# Patient Record
Sex: Male | Born: 1985
Health system: Southern US, Community
[De-identification: ages and names within clinical notes are randomized; demographics above are authoritative.]

## PROBLEM LIST (undated history)

## (undated) DIAGNOSIS — N390 Urinary tract infection, site not specified: Secondary | ICD-10-CM

## (undated) HISTORY — DX: Urinary tract infection, site not specified: N39.0

---
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
## 2007-12-14 ENCOUNTER — Emergency Department (HOSPITAL_COMMUNITY): Admission: EM | Admit: 2007-12-14 | Discharge: 2007-12-14 | Payer: Self-pay | Admitting: Family Medicine

## 2010-11-22 ENCOUNTER — Inpatient Hospital Stay (INDEPENDENT_AMBULATORY_CARE_PROVIDER_SITE_OTHER)
Admission: RE | Admit: 2010-11-22 | Discharge: 2010-11-22 | Disposition: A | Discharge status: Home / Self Care | Payer: PRIVATE HEALTH INSURANCE | Source: Ambulatory Visit | Attending: Family Medicine | Admitting: Family Medicine

## 2010-11-22 ENCOUNTER — Ambulatory Visit (INDEPENDENT_AMBULATORY_CARE_PROVIDER_SITE_OTHER): Payer: PRIVATE HEALTH INSURANCE

## 2010-11-22 DIAGNOSIS — J189 Pneumonia, unspecified organism: Secondary | ICD-10-CM

## 2010-11-25 ENCOUNTER — Emergency Department (HOSPITAL_COMMUNITY)
Admission: EM | Admit: 2010-11-25 | Discharge: 2010-11-25 | Disposition: A | Discharge status: Home / Self Care | Payer: PRIVATE HEALTH INSURANCE | Attending: Emergency Medicine | Admitting: Emergency Medicine

## 2010-11-25 ENCOUNTER — Emergency Department (HOSPITAL_COMMUNITY): Payer: PRIVATE HEALTH INSURANCE

## 2010-11-25 DIAGNOSIS — R05 Cough: Secondary | ICD-10-CM | POA: Insufficient documentation

## 2010-11-25 DIAGNOSIS — J3489 Other specified disorders of nose and nasal sinuses: Secondary | ICD-10-CM | POA: Insufficient documentation

## 2010-11-25 DIAGNOSIS — R5381 Other malaise: Secondary | ICD-10-CM | POA: Insufficient documentation

## 2010-11-25 DIAGNOSIS — J189 Pneumonia, unspecified organism: Secondary | ICD-10-CM | POA: Insufficient documentation

## 2010-11-25 DIAGNOSIS — R059 Cough, unspecified: Secondary | ICD-10-CM | POA: Insufficient documentation

## 2010-11-25 DIAGNOSIS — R51 Headache: Secondary | ICD-10-CM | POA: Insufficient documentation

## 2012-09-29 IMAGING — CR DG CHEST 2V
2 series · 2 of 2 positions shown · non-contrast
Comparison: None.

CLINICAL DATA: Dry cough for a week, lower back pain, chest
congestion

CHEST - 2 VIEW

[view not recorded (1 of 2)]
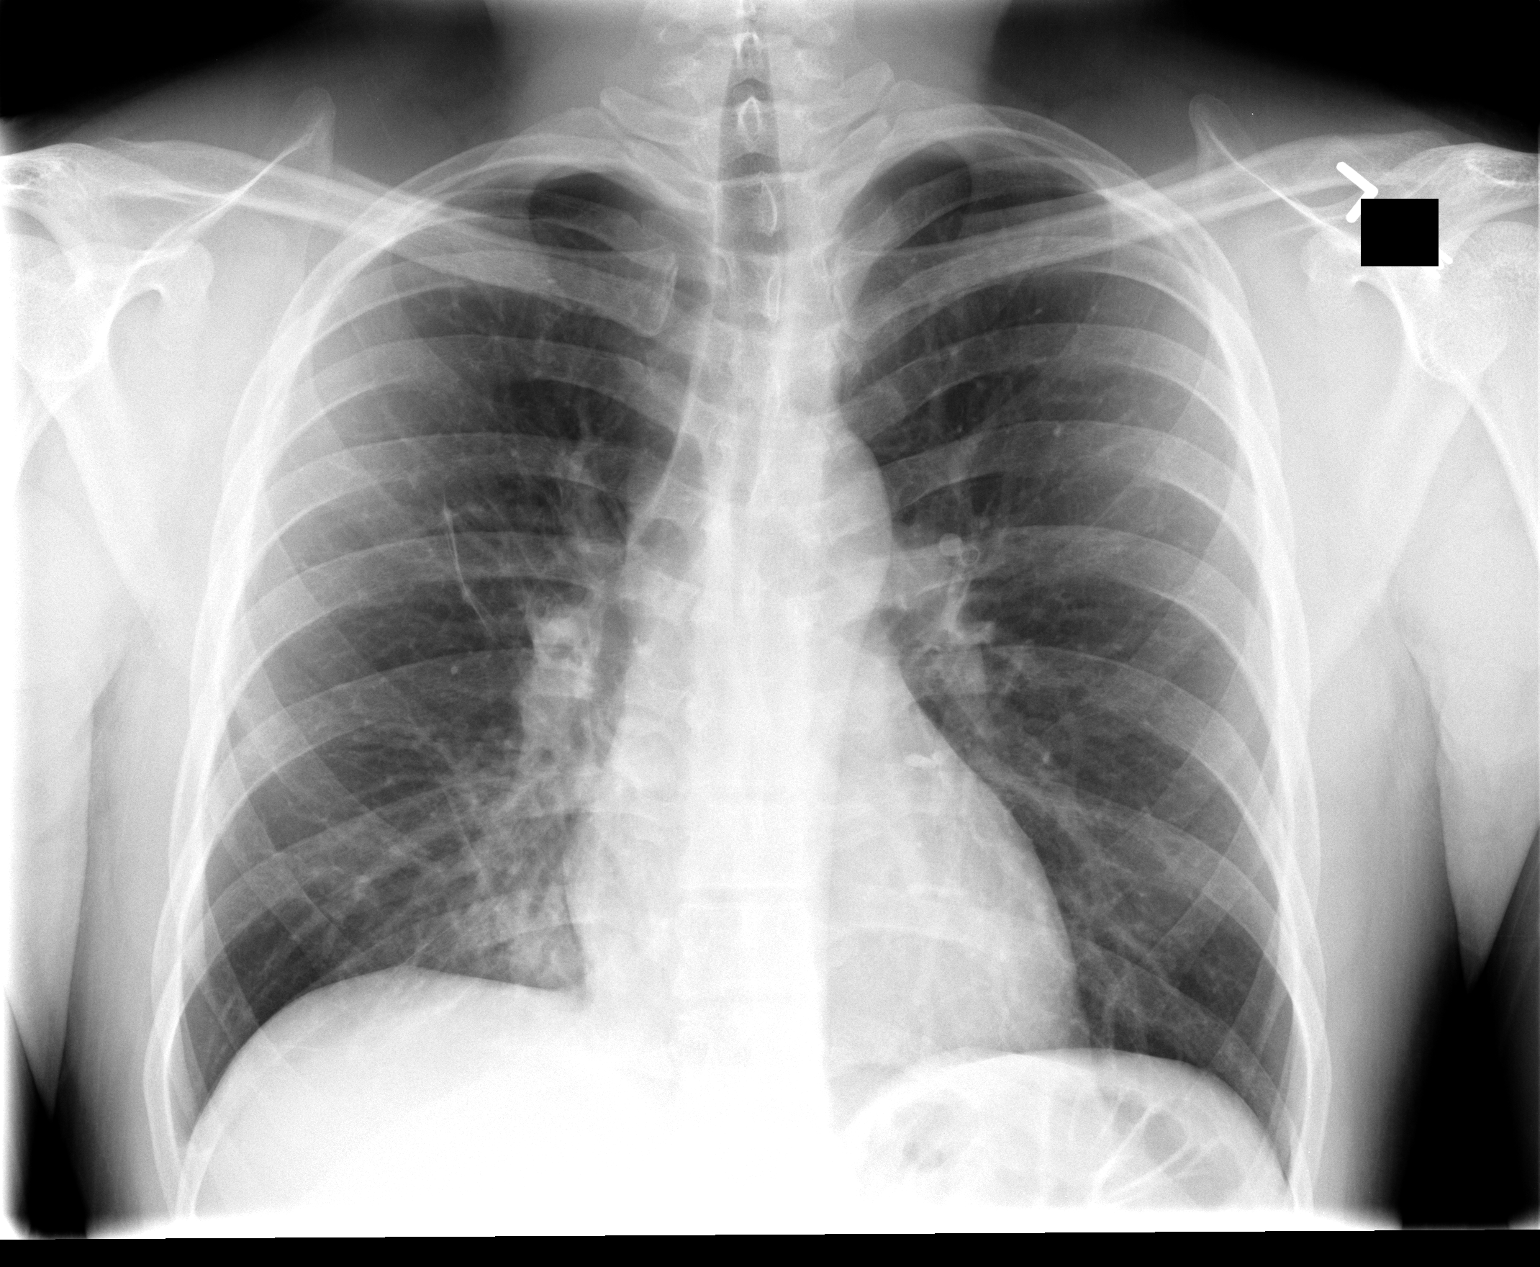

[view not recorded (2 of 2)]
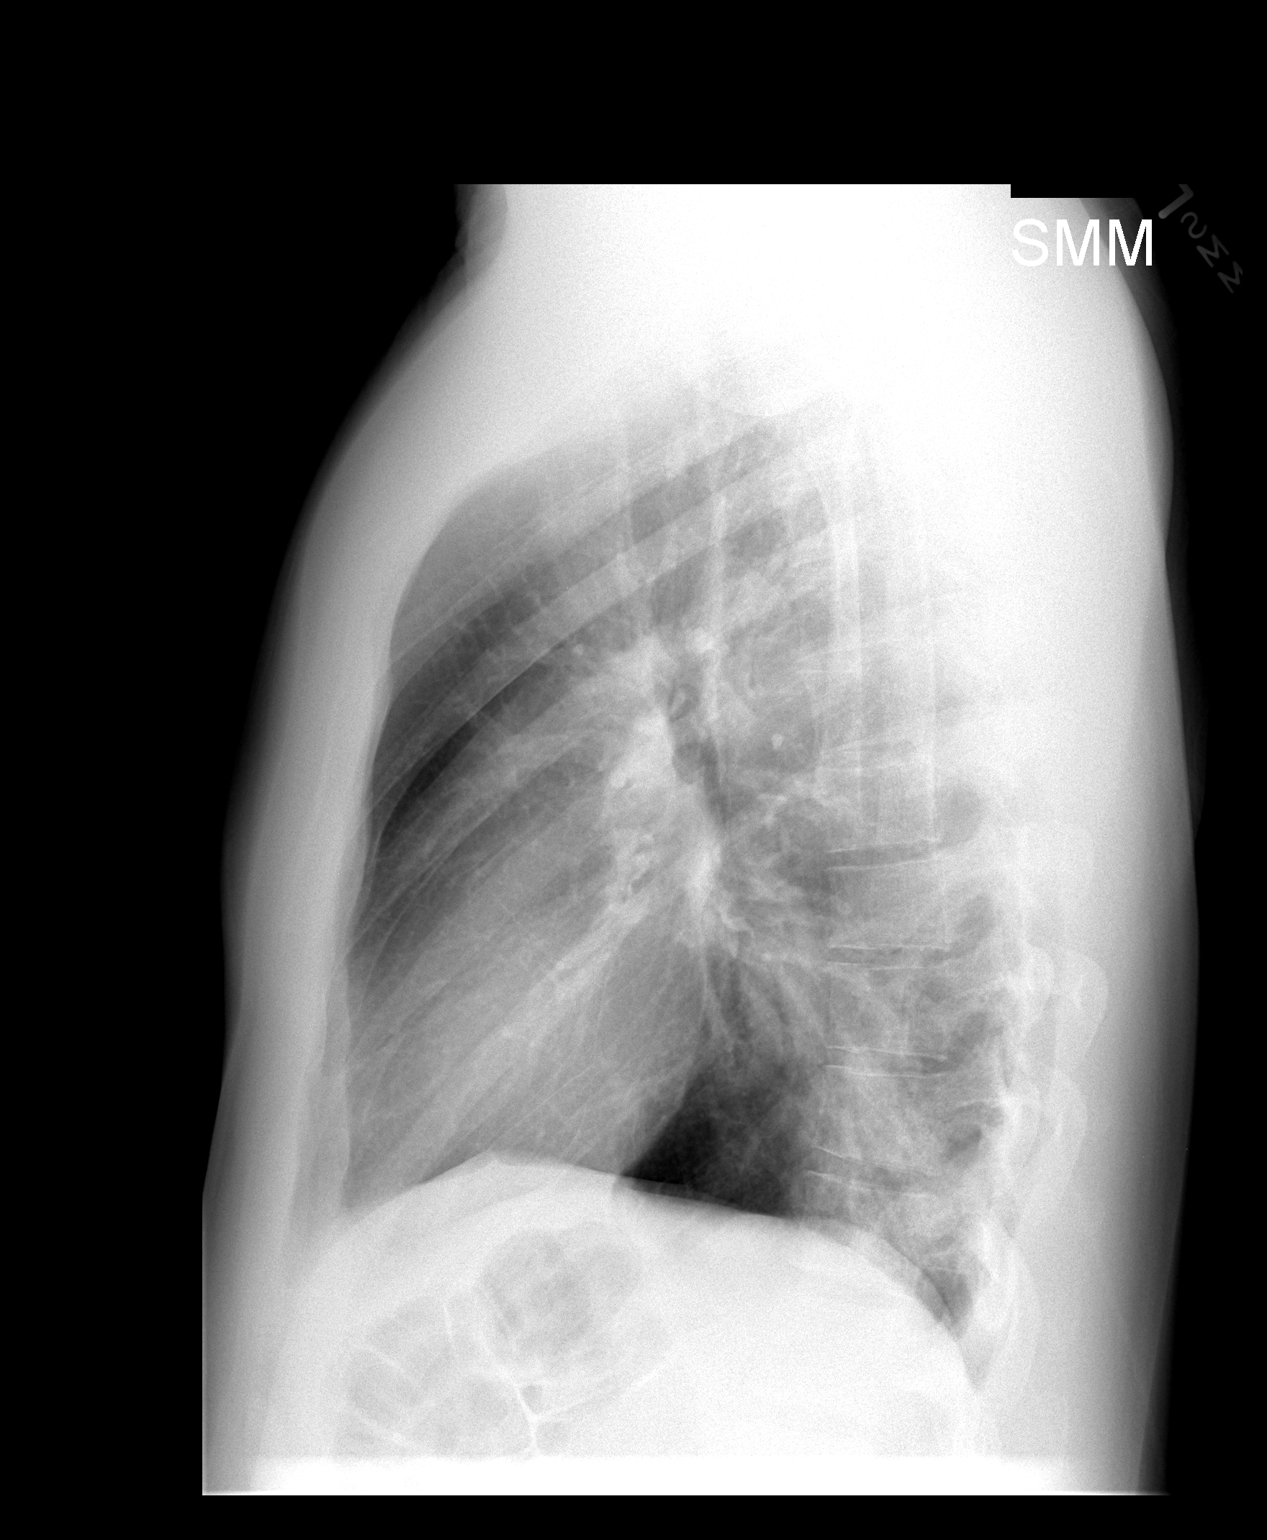

[2 of 2 positions shown; findings below may reference images not displayed]

FINDINGS: There is parenchymal opacity within the right lower lobe
posterior medially consistent with right lower lobe pneumonia.  The
left lung is clear.  No effusion is seen.  Mediastinal contours are
normal.  The heart is within normal limits in size.
IMPRESSION: Right lower lobe pneumonia.

## 2012-10-02 IMAGING — CR DG CHEST 2V
2 series · 2 of 2 positions shown · non-contrast
Comparison: 11/22/2010

CLINICAL DATA: Cough, chest pain

CHEST - 2 VIEW

[w chest pa]
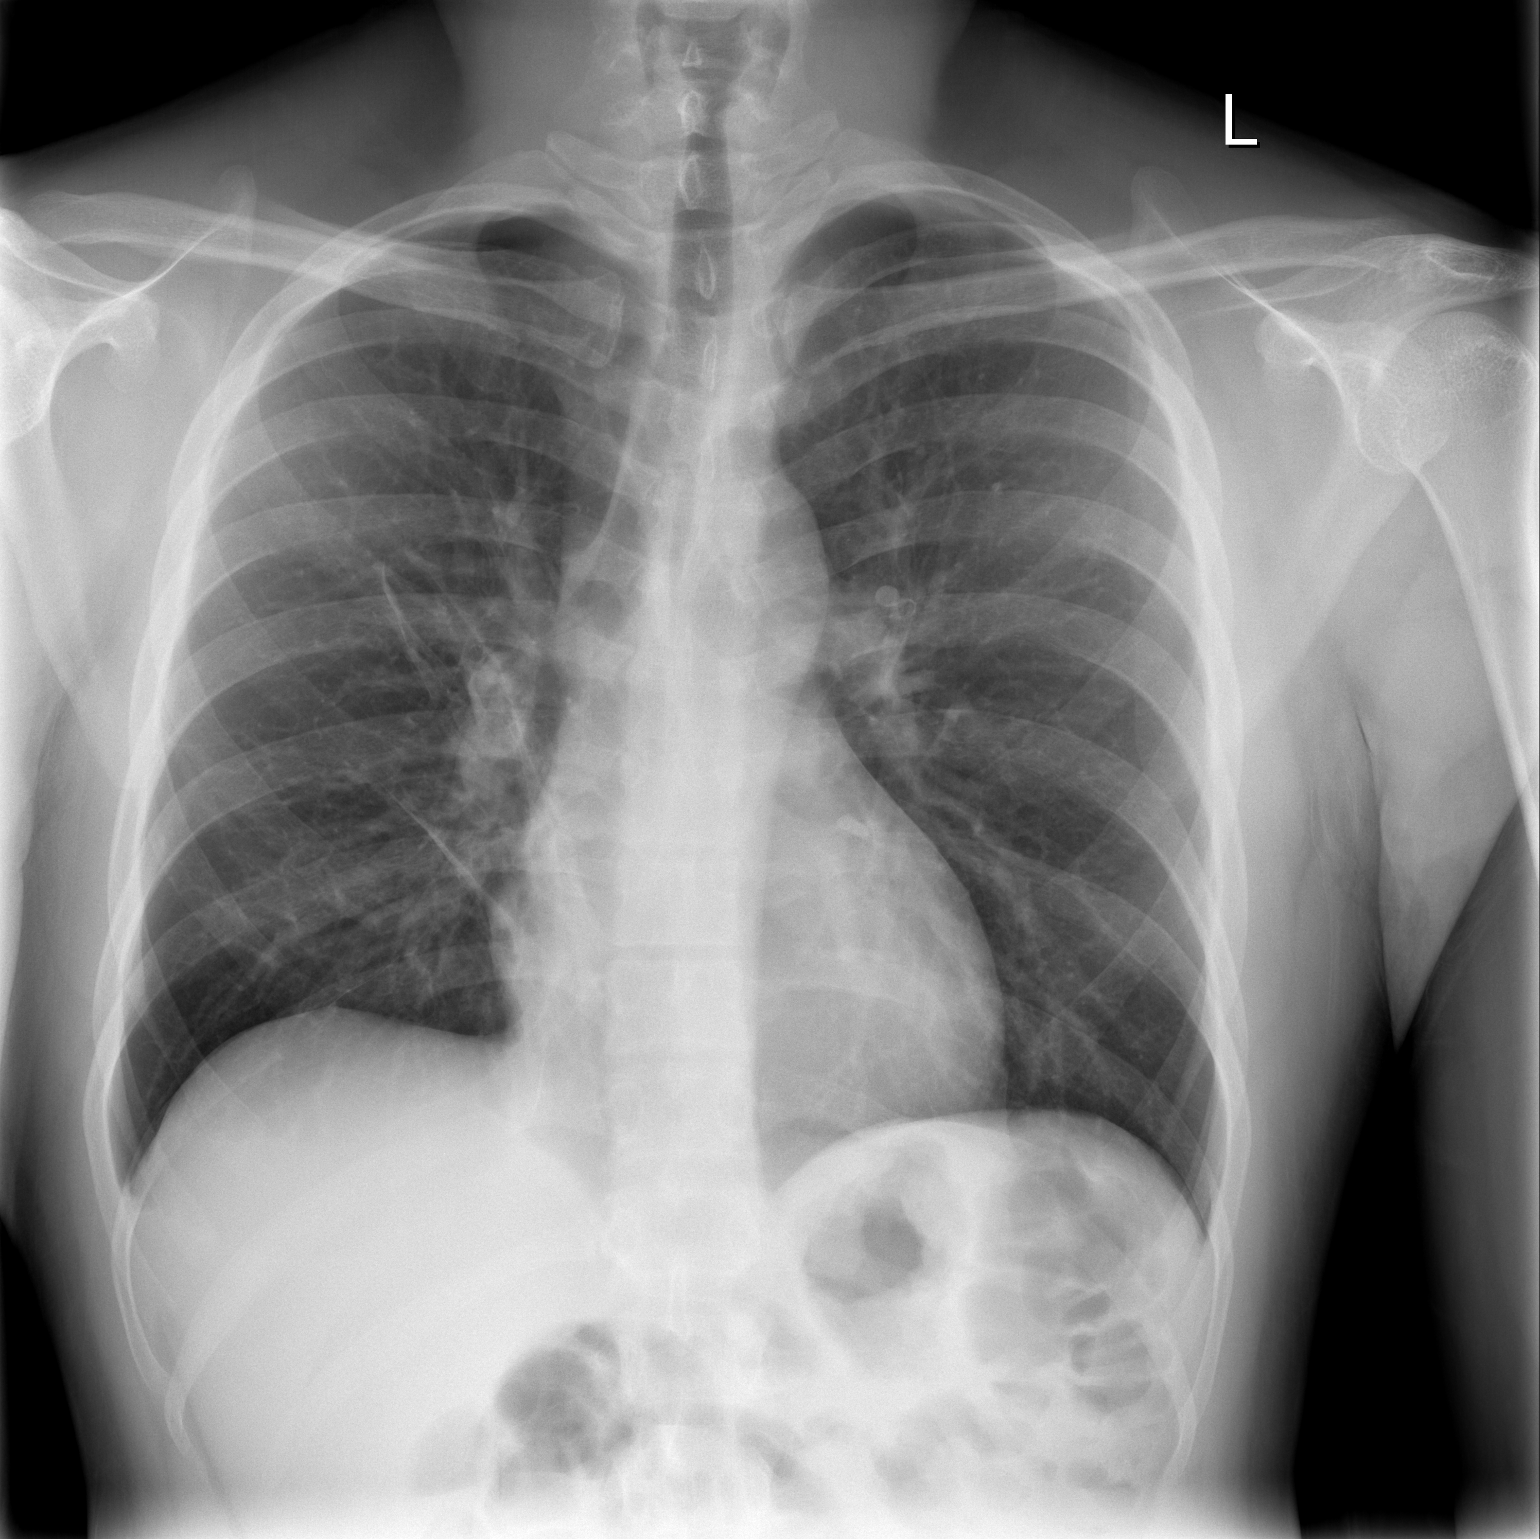

[w chest lat]
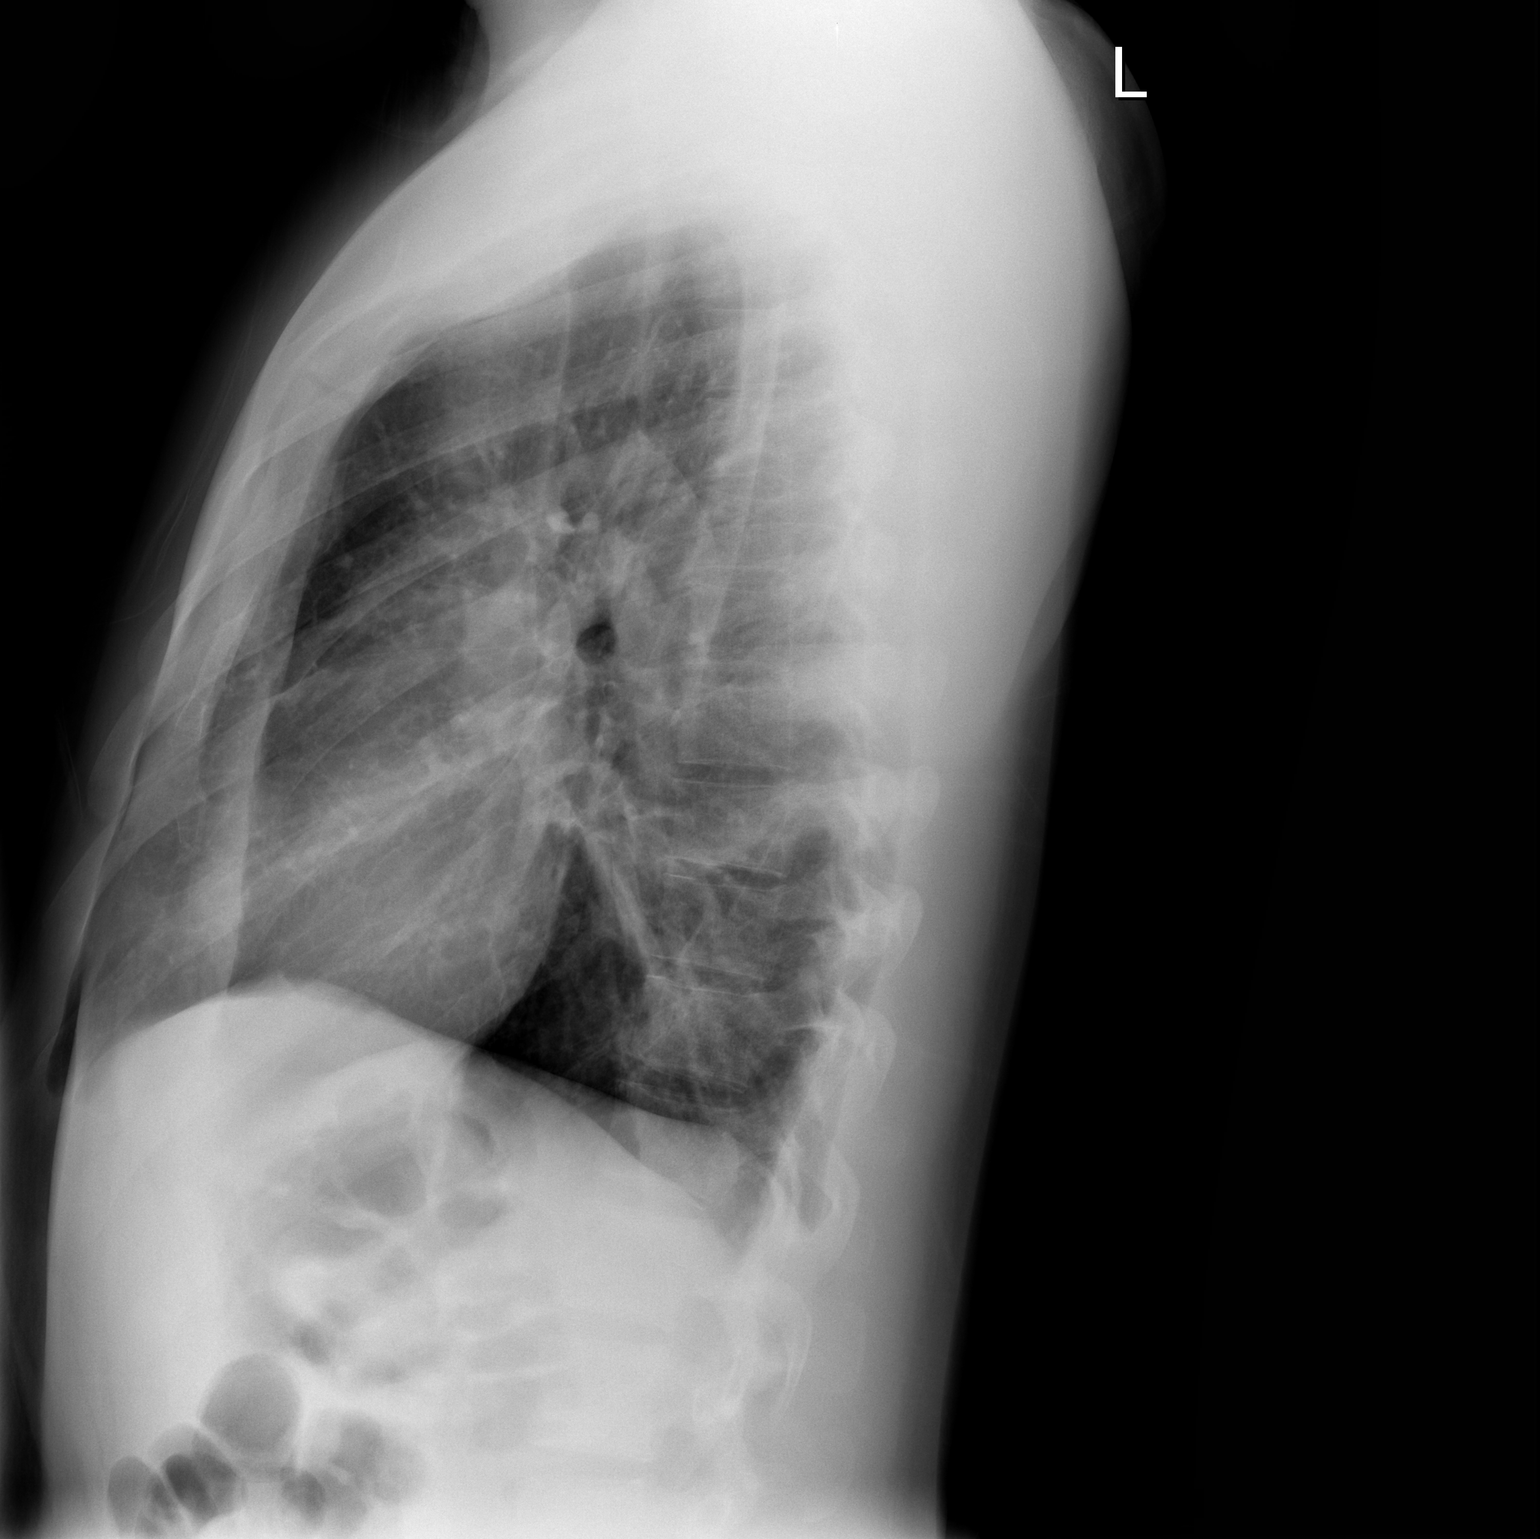

[2 of 2 positions shown; findings below may reference images not displayed]

FINDINGS: Mild medial right lower lung opacity, improved.  The left
lung is essentially clear. No pleural effusion or pneumothorax.

Cardiomediastinal silhouette is within normal limits.

Visualized osseous structures are within normal limits.
IMPRESSION: Mild medial right lower lung opacity, improved.

## 2016-12-09 DIAGNOSIS — Z23 Encounter for immunization: Secondary | ICD-10-CM | POA: Diagnosis not present

## 2017-01-05 ENCOUNTER — Ambulatory Visit (INDEPENDENT_AMBULATORY_CARE_PROVIDER_SITE_OTHER): Payer: 59 | Admitting: Family Medicine

## 2017-01-05 ENCOUNTER — Encounter: Payer: Self-pay | Admitting: Family Medicine

## 2017-01-05 VITALS — BP 122/80 | HR 72 | Temp 98.5°F | Resp 16 | Ht 69.0 in | Wt 201.0 lb

## 2017-01-05 DIAGNOSIS — Z0000 Encounter for general adult medical examination without abnormal findings: Secondary | ICD-10-CM | POA: Diagnosis not present

## 2017-01-05 DIAGNOSIS — Z114 Encounter for screening for human immunodeficiency virus [HIV]: Secondary | ICD-10-CM | POA: Diagnosis not present

## 2017-01-05 DIAGNOSIS — Z23 Encounter for immunization: Secondary | ICD-10-CM

## 2017-01-05 NOTE — Patient Instructions (Signed)
Preventive Care 18-39 Years, Male Preventive care refers to lifestyle choices and visits with your health care provider that can promote health and wellness. What does preventive care include?  A yearly physical exam. This is also called an annual well check.  Dental exams once or twice a year.  Routine eye exams. Ask your health care provider how often you should have your eyes checked.  Personal lifestyle choices, including: ? Daily care of your teeth and gums. ? Regular physical activity. ? Eating a healthy diet. ? Avoiding tobacco and drug use. ? Limiting alcohol use. ? Practicing safe sex. What happens during an annual well check? The services and screenings done by your health care provider during your annual well check will depend on your age, overall health, lifestyle risk factors, and family history of disease. Counseling Your health care provider may ask you questions about your:  Alcohol use.  Tobacco use.  Drug use.  Emotional well-being.  Home and relationship well-being.  Sexual activity.  Eating habits.  Work and work environment.  Screening You may have the following tests or measurements:  Height, weight, and BMI.  Blood pressure.  Lipid and cholesterol levels. These may be checked every 5 years starting at age 20.  Diabetes screening. This is done by checking your blood sugar (glucose) after you have not eaten for a while (fasting).  Skin check.  Hepatitis C blood test.  Hepatitis B blood test.  Sexually transmitted disease (STD) testing.  Discuss your test results, treatment options, and if necessary, the need for more tests with your health care provider. Vaccines Your health care provider may recommend certain vaccines, such as:  Influenza vaccine. This is recommended every year.  Tetanus, diphtheria, and acellular pertussis (Tdap, Td) vaccine. You may need a Td booster every 10 years.  Varicella vaccine. You may need this if you  have not been vaccinated.  HPV vaccine. If you are 29 or younger, you may need three doses over 6 months.  Measles, mumps, and rubella (MMR) vaccine. You may need at least one dose of MMR.You may also need a second dose.  Pneumococcal 13-valent conjugate (PCV13) vaccine. You may need this if you have certain conditions and have not been vaccinated.  Pneumococcal polysaccharide (PPSV23) vaccine. You may need one or two doses if you smoke cigarettes or if you have certain conditions.  Meningococcal vaccine. One dose is recommended if you are age 29-21 years and a first-year college student living in a residence hall, or if you have one of several medical conditions. You may also need additional booster doses.  Hepatitis A vaccine. You may need this if you have certain conditions or if you travel or work in places where you may be exposed to hepatitis A.  Hepatitis B vaccine. You may need this if you have certain conditions or if you travel or work in places where you may be exposed to hepatitis B.  Haemophilus influenzae type b (Hib) vaccine. You may need this if you have certain risk factors.  Talk to your health care provider about which screenings and vaccines you need and how often you need them. This information is not intended to replace advice given to you by your health care provider. Make sure you discuss any questions you have with your health care provider. Document Released: 03/25/2001 Document Revised: 10/17/2015 Document Reviewed: 11/28/2014 Elsevier Interactive Patient Education  2017 Elsevier Inc.  

## 2017-01-05 NOTE — Progress Notes (Signed)
Patient: Daniel Whitehead, Male    DOB: 22-Jan-1986, 31 y.o.   MRN: [id number] Visit Date: 01/05/2017  Today's Provider: Lavon Paganini, MD   Chief Complaint  Patient presents with  . Establish Care  . Annual Exam   Subjective:    Annual physical exam Daniel Whitehead is a 31 y.o. male who presents today for health maintenance and complete physical. He feels well. He reports he is not exercising. He reports he is sleeping fairly well.  ----------------------------------------------------------------- Kyrin presents to establish care. He states he has not had a primary since he was in high school. Is requesting lab work today. He has no complaints, but does state he gets a headache about once a week at nighttime for a long time.  He is not worried about this.  Review of Systems  Constitutional: Negative.   HENT: Negative.   Eyes: Negative.   Respiratory: Negative.   Cardiovascular: Negative.   Gastrointestinal: Negative.   Endocrine: Negative.   Genitourinary: Negative.   Musculoskeletal: Negative.   Skin: Negative.   Allergic/Immunologic: Negative.   Neurological: Positive for headaches. Negative for dizziness, tremors, seizures, syncope, facial asymmetry, speech difficulty, weakness, light-headedness and numbness.  Hematological: Negative.   Psychiatric/Behavioral: Negative.     Social History      He  reports that  has never smoked. he has never used smokeless tobacco. He reports that he does not drink alcohol or use drugs.       Social History   Socioeconomic History  . Marital status: Married    Spouse name: [name]  . Number of children: 0  . Years of education: None  . Highest education level: Bachelor's degree (e.g., BA, AB, BS)  Social Needs  . Financial resource strain: None  . Food insecurity - worry: None  . Food insecurity - inability: None  . Transportation needs - medical: None  . Transportation needs - non-medical: None  Occupational History   . Occupation: Marketing executive: [employer]  Tobacco Use  . Smoking status: Never Smoker  . Smokeless tobacco: Never Used  Substance and Sexual Activity  . Alcohol use: No    Frequency: Never  . Drug use: No  . Sexual activity: Yes    Partners: Female    Birth control/protection: None  Other Topics Concern  . None  Social History Narrative  . None    History reviewed. No pertinent past medical history.   Patient Active Problem List   Diagnosis Date Noted  . Healthcare maintenance 01/05/2017    Past Surgical History:  Procedure Laterality Date  . [place] EXTRACTION  2005    Family History        Family Status  Relation Name Status  . Mother  Alive  . Father  Deceased  . Sister  Alive  . MGF  (Not Specified)  . PGM  (Not Specified)        His family history includes Cataracts in his maternal grandfather; Healthy in his mother and sister; Heart disease in his paternal grandmother; Hypertension in his paternal grandmother; Lung cancer in his father.     No Known Allergies   Current Outpatient Medications:  Marland Kitchen  Multiple Vitamins-Minerals (ZINC PO), Take 1 tablet by mouth every other day., Disp: , Rfl:  .  vitamin C (ASCORBIC ACID) 500 MG tablet, Take 500 mg by mouth daily., Disp: , Rfl:    Patient Care Team: Virginia Crews, MD as PCP -  General (Family Medicine)      Objective:   Vitals: BP 122/80 (BP Location: Left Arm, Patient Position: Sitting, Cuff Size: Large)   Pulse 72   Temp 98.5 F (36.9 C) (Oral)   Resp 16   Ht 5' 9"  (1.753 m)   Wt 201 lb (91.2 kg)   BMI 29.68 kg/m    Vitals:   01/05/17 1402  BP: 122/80  Pulse: 72  Resp: 16  Temp: 98.5 F (36.9 C)  TempSrc: Oral  Weight: 201 lb (91.2 kg)  Height: 5' 9"  (1.753 m)     Physical Exam  Constitutional: He is oriented to person, place, and time. He appears well-developed and well-nourished. No distress.  HENT:  Head: Normocephalic and atraumatic.  Right Ear:  External ear normal.  Left Ear: External ear normal.  Nose: Nose normal.  Mouth/Throat: Oropharynx is clear and moist.  Eyes: Conjunctivae are normal. Pupils are equal, round, and reactive to light. No scleral icterus.  Neck: Neck supple. No thyromegaly present.  Cardiovascular: Normal rate, regular rhythm, normal heart sounds and intact distal pulses.  No murmur heard. Pulmonary/Chest: Breath sounds normal. No respiratory distress. He has no wheezes. He has no rales.  Abdominal: Soft. Bowel sounds are normal. He exhibits no distension. There is no tenderness. There is no rebound and no guarding.  Musculoskeletal: He exhibits no edema or deformity.  Lymphadenopathy:    He has no cervical adenopathy.  Neurological: He is alert and oriented to person, place, and time.  Skin: Skin is warm and dry. No rash noted.  Psychiatric: He has a normal mood and affect. His behavior is normal.  Vitals reviewed.    Depression Screen PHQ 2/9 Scores 01/05/2017  PHQ - 2 Score 0     Assessment & Plan:     Routine Health Maintenance and Physical Exam  Exercise Activities and Dietary recommendations Goals    None      Immunization History  Administered Date(s) Administered  . DTaP 03/05/1988, 05/07/1988, 07/08/1988, 04/08/1989, 05/28/1992  . Hepatitis B 10/21/1999, 11/25/1999, 04/06/2000  . IPV 03/05/1988, 05/07/1988, 04/08/1989, 05/28/1992  . Influenza,inj,Quad PF,6+ Mos 12/09/2016  . MMR 04/08/1989, 08/30/1993  . Meningococcal Conjugate 03/17/2005    Health Maintenance  Topic Date Due  . HIV Screening  01/05/2003  . TETANUS/TDAP  01/05/2007  . INFLUENZA VACCINE  09/10/2016     Discussed health benefits of physical activity, and encouraged him to engage in regular exercise appropriate for his age and condition.     Problem List Items Addressed This Visit      Other   Healthcare maintenance - Primary   Relevant Orders   HIV antibody (with reflex)   Comprehensive metabolic  panel   CBC    Other Visit Diagnoses    Encounter for screening for HIV       Relevant Orders   HIV antibody (with reflex)      -------------------------------------------------------------------- Return in about 1 year (around 01/05/2018) for Physical.   The entirety of the information documented in the History of Present Illness, Review of Systems and Physical Exam were personally obtained by me. Portions of this information were initially documented by Raquel Sarna Ratchford, CMA and reviewed by me for thoroughness and accuracy.     Lavon Paganini, MD  [facility]

## 2017-01-05 NOTE — Addendum Note (Signed)
Addended by: Tobi BastosATCHFORD, Onyx Edgley M on: 01/05/2017 02:53 PM   Modules accepted: Orders

## 2017-01-06 LAB — COMPLETE METABOLIC PANEL WITH GFR
AG RATIO: 2 (calc) (ref 1.0–2.5)
ALBUMIN MSPROF: 4.5 g/dL (ref 3.6–5.1)
ALKALINE PHOSPHATASE (APISO): 49 U/L (ref 40–115)
ALT: 15 U/L (ref 9–46)
AST: 13 U/L (ref 10–40)
BUN: 8 mg/dL (ref 7–25)
CO2: 29 mmol/L (ref 20–32)
CREATININE: 0.74 mg/dL (ref 0.60–1.35)
Calcium: 8.8 mg/dL (ref 8.6–10.3)
Chloride: 106 mmol/L (ref 98–110)
GFR, Est African American: 144 mL/min/{1.73_m2} (ref >=60)
GFR, Est Non African American: 125 mL/min/{1.73_m2} (ref >=60)
GLOBULIN: 2.2 g/dL (ref 1.9–3.7)
Glucose, Bld: 83 mg/dL (ref 65–99)
Potassium: 4.2 mmol/L (ref 3.5–5.3)
SODIUM: 140 mmol/L (ref 135–146)
Total Bilirubin: 0.6 mg/dL (ref 0.2–1.2)
Total Protein: 6.7 g/dL (ref 6.1–8.1)

## 2017-01-06 LAB — CBC
HCT: 43.6 % (ref 38.5–50.0)
Hemoglobin: 13.6 g/dL (ref 13.2–17.1)
MCH: 24 pg — AB (ref 27.0–33.0)
MCHC: 31.2 g/dL — ABNORMAL LOW (ref 32.0–36.0)
MCV: 76.9 fL — AB (ref 80.0–100.0)
MPV: 11.5 fL (ref 7.5–12.5)
PLATELETS: 227 10*3/uL (ref 140–400)
RBC: 5.67 10*6/uL (ref 4.20–5.80)
RDW: 14.2 % (ref 11.0–15.0)
WBC: 3.8 10*3/uL (ref 3.8–10.8)

## 2017-01-06 LAB — HIV ANTIBODY (ROUTINE TESTING W REFLEX): HIV 1&2 Ab, 4th Generation: NONREACTIVE

## 2017-01-07 ENCOUNTER — Telehealth: Payer: Self-pay

## 2017-01-07 NOTE — Telephone Encounter (Signed)
Pt advised.

## 2017-01-07 NOTE — Telephone Encounter (Signed)
-----   Message from Erasmo DownerAngela M Bacigalupo, MD sent at 01/07/2017  1:47 PM EST ----- Normal kidney function, liver function, electrolytes, Blood counts.  Negative HIV screening  Bacigalupo, Marzella SchleinAngela M, MD, MPH [facility] 01/07/2017 1:46 PM

## 2017-08-19 DIAGNOSIS — N390 Urinary tract infection, site not specified: Secondary | ICD-10-CM | POA: Diagnosis not present

## 2017-08-19 DIAGNOSIS — R350 Frequency of micturition: Secondary | ICD-10-CM | POA: Diagnosis not present

## 2017-08-27 ENCOUNTER — Ambulatory Visit: Payer: 59 | Admitting: Family Medicine

## 2017-11-04 DIAGNOSIS — Z23 Encounter for immunization: Secondary | ICD-10-CM | POA: Diagnosis not present

## 2017-11-13 ENCOUNTER — Other Ambulatory Visit: Payer: Self-pay | Admitting: Certified Nurse Midwife

## 2017-11-13 LAB — SEMEN ANALYSIS, BASIC
519106: 23 % — AB (ref >31)
Appearance: NORMAL
Concentration, Sperm: 9.7 x10E6/mL — ABNORMAL LOW (ref >14.9)
IMMOTILE SPERM: 43 %
Leukocyte Concentration: NORMAL x10E6/mL (ref <1.00)
NON-PROGRESSIVE (NP): 34 %
Normal Morphology-Strict: 5 % (ref >3)
PH: 8.5 (ref >7.1)
Progressively Motile Sperm: 10.4 x10E6
TIME RECEIVED: 1405
TOTAL MOTILE SPERM: 26 x10E6
TOTAL MOTILITY (PR+NP): 57 % (ref >39)
Time Collected: 1350
Time Since Last Emission: 7 days
Total Sperm in Ejaculate: 45.5 x10E6 (ref >38.9)
VOLUME: 4.7 mL (ref >1.4)

## 2017-11-18 ENCOUNTER — Telehealth: Payer: Self-pay

## 2017-11-18 NOTE — Telephone Encounter (Signed)
Message left for pt to please return my call re: test results 

## 2017-11-20 ENCOUNTER — Telehealth: Payer: Self-pay

## 2017-11-20 NOTE — Telephone Encounter (Signed)
Pt requests referral to urologist. Message sent to AT.

## 2017-11-23 ENCOUNTER — Other Ambulatory Visit: Payer: Self-pay | Admitting: Certified Nurse Midwife

## 2017-11-23 DIAGNOSIS — R869 Unspecified abnormal finding in specimens from male genital organs: Secondary | ICD-10-CM

## 2017-11-23 NOTE — Progress Notes (Signed)
Order placed for referral to urology for abnormal semen analysis per request.   Doreene Burke, CNM

## 2017-11-24 ENCOUNTER — Telehealth: Payer: Self-pay | Admitting: Urology

## 2017-12-03 NOTE — Telephone Encounter (Signed)
error 

## 2017-12-17 ENCOUNTER — Encounter: Payer: Self-pay | Admitting: Urology

## 2017-12-17 ENCOUNTER — Other Ambulatory Visit: Payer: Self-pay

## 2017-12-17 ENCOUNTER — Ambulatory Visit: Payer: 59 | Admitting: Urology

## 2017-12-17 VITALS — BP 127/76 | HR 81 | Ht 69.0 in | Wt 198.8 lb

## 2017-12-17 DIAGNOSIS — I861 Scrotal varices: Secondary | ICD-10-CM | POA: Diagnosis not present

## 2017-12-17 DIAGNOSIS — N4611 Organic oligospermia: Secondary | ICD-10-CM

## 2017-12-17 NOTE — Progress Notes (Signed)
12/17/17 12:21 PM   [name] 1985/11/02 [id number]  Referring provider: Erasmo Downer, MD [address] [place], [place] [address]  Chief Complaint  Patient presents with  . Establish Care    HPI: Daniel Whitehead is a 32 y.o. male who presents today regarding his recent 11/13/17 abnormal Semen Analysis.  The pt reports that he and his partner have been trying to become pregnant for the past 13 months. The pt's partner has been seeing a nurse midwife for evaluation of her fertility without any noteworthy or abnormal findings so far, and has normal menstrual cycles and has not had children before.  She is 62 years old.  The pt has also not had children before. The pt notes that his partner has been tracking her cycles and has used ovulation test strips. He has been using [place], Pre-seed and Conceive plus as lubricants for the previous four months as well.  The pt denies any family history of genetic or fertility issues.    He denies having a cold or concerns for infection at the time. He believes that he caught all of the semen for analysis, and had last ejaculated six days prior. The patient adenies any trauma to the testicles, testicular pain or swelling, and difficulty urinating.    PMH: History reviewed. No pertinent past medical history.  Surgical History: Past Surgical History:  Procedure Laterality Date  . [place] EXTRACTION  2005    Home Medications:  Allergies as of 12/17/2017   No Known Allergies     Medication List        Accurate as of 12/17/17 12:21 PM. Always use your most recent med list.          vitamin C 500 MG tablet Commonly known as:  ASCORBIC ACID Take 500 mg by mouth daily.   ZINC PO Take 1 tablet by mouth every other day.       Allergies: No Known Allergies  Family History: Family History  Problem Relation Age of Onset  . Healthy Mother   . Lung cancer Father        smoker  . Healthy Sister   . Cataracts  Maternal Grandfather   . Hypertension Paternal Grandmother   . Heart disease Paternal Grandmother   . Prostate cancer Neg Hx   . Kidney cancer Neg Hx   . Bladder Cancer Neg Hx     Social History:  reports that he has never smoked. He has never used smokeless tobacco. He reports that he does not drink alcohol or use drugs.  ROS: UROLOGY Frequent Urination?: No Hard to postpone urination?: No Burning/pain with urination?: No Get up at night to urinate?: No Leakage of urine?: No Urine stream starts and stops?: No Trouble starting stream?: No Do you have to strain to urinate?: No Blood in urine?: No Urinary tract infection?: No Sexually transmitted disease?: No Injury to kidneys or bladder?: No Painful intercourse?: No Weak stream?: No Erection problems?: Yes Penile pain?: No  Gastrointestinal Nausea?: No Vomiting?: No Indigestion/heartburn?: No Diarrhea?: No Constipation?: No  Constitutional Fever: No Night sweats?: Yes Weight loss?: No Fatigue?: No  Skin Skin rash/lesions?: No Itching?: No  Eyes Blurred vision?: No Double vision?: No  Ears/Nose/Throat Sore throat?: No Sinus problems?: No  Hematologic/Lymphatic Swollen glands?: No Easy bruising?: No  Cardiovascular Leg swelling?: No Chest pain?: No  Respiratory Cough?: No Shortness of breath?: No  Endocrine Excessive thirst?: No  Musculoskeletal Back pain?: No Joint pain?: No  Neurological Headaches?: No Dizziness?: No  Psychologic Depression?: No Anxiety?: No  Physical Exam: BP 127/76   Pulse 81   Ht 5\' 9"  (1.753 m)   Wt 198 lb 12.8 oz (90.2 kg)   BMI 29.36 kg/m   Constitutional:  Alert and oriented, No acute distress. HEENT: [place] AT, moist mucus membranes.  Trachea midline, no masses. Cardiovascular: No clubbing, cyanosis, or edema. Respiratory: Normal respiratory effort, no increased work of breathing. GI: Abdomen is soft, nontender, nondistended, no abdominal masses GU: No  CVA tenderness. Bilateral distend testicles without masses or tenderness, bilateral palpable vasa, average testicular volume, some fullness in left cord possibly grade 1, normal circumcised phallus with orthotopic meatus that is widely patent  Endocrine: muscular physique, full beard, normal distribution of pubic hair  Skin: No rashes, bruises or suspicious lesions. Neurologic: Grossly intact, no focal deficits, moving all 4 extremities. Psychiatric: Normal mood and affect.  Laboratory Data: Lab Results  Component Value Date   WBC 3.8 01/05/2017   HGB 13.6 01/05/2017   HCT 43.6 01/05/2017   MCV 76.9 (L) 01/05/2017   PLT 227 01/05/2017    Lab Results  Component Value Date   CREATININE 0.74 01/05/2017   Component     Latest Ref Rng & Units 11/13/2017  Time Collected      1350  Time Received      1405  Time Since Last Emission     days 7  Appearance      Normal  Liquefaction      Comment  Viscosity      Comment  Volume     >1.4 mL 4.7  pH     >7.1 8.5  Concentration, Sperm     >14.9 x10E6/mL 9.7 (L)  Total Sperm in Ejaculate     >38.9 x10E6 45.5  Total Motility (PR+NP)     >39 % 57  Progressive Motility (PR)     >31 % 23 (L)  Non-Progressive (NP)     % 34  Immotile Sperm     % 43  Total Motile Sperm     x10E6 26.0  Progressively Motile Sperm     x10E6 10.4  Normal Morphology-Strict     >3 % 5  Leukocyte Concentration     <1.00 x10E6/mL Normal     Assessment & Plan:    1. 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 Terressa Koyanagi at infertility clinic at [facility] -Discussed that the pt and his partner could consider an IUI procedure    -Discussed that I did palpate some fullness when examining for varicoceles, and this could be the cause for lower sperm concentration, see below -Will follow up with the pt with repeat semen analysis results   2. Left varicocele Subtle possible left varicocele which may or may not be contributing factor Plan to repeat SA, if normal no further  evalation If repeat abnormal, will order scrotal US prior to referral to expedite work up/ treatment   Will call with repeat SA result and plan  Vanna Scotland, MD  [facility] [address] [place], [place] [address] [phone]  I, Marcelline Mates, am acting as a scribe for Dr. Apolinar Junes  I have reviewed the above documentation for accuracy and completeness, and I agree with the above.   Vanna Scotland, MD

## 2018-01-11 ENCOUNTER — Ambulatory Visit (INDEPENDENT_AMBULATORY_CARE_PROVIDER_SITE_OTHER): Payer: 59 | Admitting: Family Medicine

## 2018-01-11 ENCOUNTER — Encounter: Payer: Self-pay | Admitting: Family Medicine

## 2018-01-11 VITALS — BP 126/78 | HR 80 | Temp 98.3°F | Ht 69.0 in | Wt 197.2 lb

## 2018-01-11 DIAGNOSIS — Z0000 Encounter for general adult medical examination without abnormal findings: Secondary | ICD-10-CM

## 2018-01-11 DIAGNOSIS — Z13220 Encounter for screening for lipoid disorders: Secondary | ICD-10-CM | POA: Diagnosis not present

## 2018-01-11 DIAGNOSIS — L700 Acne vulgaris: Secondary | ICD-10-CM | POA: Diagnosis not present

## 2018-01-11 NOTE — Progress Notes (Signed)
Patient: Daniel Whitehead, Male    DOB: January 16, 1986, 32 y.o.   MRN: [id number] Visit Date: 01/11/2018  Today's Provider: Lavon Paganini, MD   Chief Complaint  Patient presents with  . Annual Exam   Subjective:  I, Tiburcio Pea, CMA, am acting as a scribe for Lavon Paganini, MD.    Annual physical exam Daniel Whitehead is a 32 y.o. male who presents today for health maintenance and complete physical. He feels well. He reports exercising none. He reports he is sleeping fairly well.  Acne on chest: Acne body wash helps when using it (not sure if it is benzoyl peroxide or salicylic acid.  After using it, his acne improved for 1 to 2 days, but then seems to flare back up.  This is been ongoing for years.  He was hoping that he be able to be treated and will be gone and have come back. -----------------------------------------------------------------   Review of Systems  Constitutional: Negative.   HENT: Negative.   Eyes: Negative.   Respiratory: Negative.   Cardiovascular: Negative.   Gastrointestinal: Negative.   Endocrine: Negative.   Genitourinary: Negative.   Musculoskeletal: Negative.   Skin: Positive for rash.  Allergic/Immunologic: Negative.   Neurological: Negative.   Hematological: Negative.   Psychiatric/Behavioral: Negative.     Social History      He  reports that he has never smoked. He has never used smokeless tobacco. He reports that he does not drink alcohol or use drugs.       Social History   Socioeconomic History  . Marital status: Married    Spouse name: [name]  . Number of children: 0  . Years of education: Not on file  . Highest education level: Bachelor's degree (e.g., BA, AB, BS)  Occupational History  . Occupation: Marketing executive: [employer]  . Financial resource strain: Not on file  . Food insecurity:    Worry: Not on file    Inability: Not on file  . Transportation needs:    Medical: Not on file    Non-medical: Not on file  Tobacco Use  . Smoking status: Never Smoker  . Smokeless tobacco: Never Used  Substance and Sexual Activity  . Alcohol use: No    Frequency: Never  . Drug use: No  . Sexual activity: Yes    Partners: Female    Birth control/protection: None  Lifestyle  . Physical activity:    Days per week: 0 days    Minutes per session: Not on file  . Stress: Not on file  Relationships  . Social connections:    Talks on phone: Not on file    Gets together: Not on file    Attends religious service: Not on file    Active member of club or organization: Not on file    Attends meetings of clubs or organizations: Not on file    Relationship status: Not on file  Other Topics Concern  . Not on file  Social History Narrative  . Not on file    History reviewed. No pertinent past medical history.   Patient Active Problem List   Diagnosis Date Noted  . Healthcare maintenance 01/05/2017    Past Surgical History:  Procedure Laterality Date  . [place] EXTRACTION  2005    Family History        Family Status  Relation Name Status  . Mother  Alive  . Father  Deceased  .  Sister  Alive  . MGF  (Not Specified)  . PGM  (Not Specified)  . Neg Hx  (Not Specified)        His family history includes Cataracts in his maternal grandfather; Healthy in his mother and sister; Heart disease in his paternal grandmother; Hypertension in his paternal grandmother; Lung cancer in his father. There is no history of Prostate cancer, Kidney cancer, or Bladder Cancer.      No Known Allergies   Current Outpatient Medications:  Marland Kitchen  Multiple Vitamins-Minerals (ZINC PO), Take 1 tablet by mouth every other day., Disp: , Rfl:  .  vitamin C (ASCORBIC ACID) 500 MG tablet, Take 500 mg by mouth daily., Disp: , Rfl:    Patient Care Team: Virginia Crews, MD as PCP - General (Family Medicine)      Objective:   Vitals: BP 126/78 (BP Location: Right Arm, Patient Position: Sitting,  Cuff Size: Normal)   Pulse 80   Temp 98.3 F (36.8 C) (Oral)   Ht _0  (1.753 m)   Wt 197 lb 3.2 oz (89.4 kg)   SpO2 99%   BMI 29.12 kg/m    Vitals:   01/11/18 0900  BP: 126/78  Pulse: 80  Temp: 98.3 F (36.8 C)  TempSrc: Oral  SpO2: 99%  Weight: 197 lb 3.2 oz (89.4 kg)  Height: _1  (1.753 m)     Physical Exam  Constitutional: He is oriented to person, place, and time. He appears well-developed and well-nourished. No distress.  HENT:  Head: Normocephalic and atraumatic.  Right Ear: External ear normal.  Left Ear: External ear normal.  Nose: Nose normal.  Mouth/Throat: Oropharynx is clear and moist.  Eyes: Pupils are equal, round, and reactive to light. Conjunctivae and EOM are normal. No scleral icterus.  Neck: Neck supple. No thyromegaly present.  Cardiovascular: Normal rate, regular rhythm, normal heart sounds and intact distal pulses.  No murmur heard. Pulmonary/Chest: Effort normal and breath sounds normal. No respiratory distress. He has no wheezes. He has no rales.  Abdominal: Soft. Bowel sounds are normal. He exhibits no distension. There is no tenderness. There is no rebound and no guarding.  Musculoskeletal: He exhibits no edema or deformity.  Lymphadenopathy:    He has no cervical adenopathy.  Neurological: He is alert and oriented to person, place, and time.  Skin: Skin is warm and dry. Capillary refill takes less than 2 seconds.  Mild acne on chest with hyperpigmented areas  Psychiatric: He has a normal mood and affect. His behavior is normal.  Vitals reviewed.    Depression Screen PHQ 2/9 Scores 01/11/2018 01/05/2017  PHQ - 2 Score 0 0  PHQ- 9 Score 0 -    Assessment & Plan:     Routine Health Maintenance and Physical Exam  Exercise Activities and Dietary recommendations Goals   None     Immunization History  Administered Date(s) Administered  . DTaP 03/05/1988, 05/07/1988, 07/08/1988, 04/08/1989, 05/28/1992  . Hepatitis B 10/21/1999,  11/25/1999, 04/06/2000  . IPV 03/05/1988, 05/07/1988, 04/08/1989, 05/28/1992  . Influenza Inj Mdck Quad Pf 11/04/2017  . Influenza,inj,Quad PF,6+ Mos 12/09/2016  . MMR 04/08/1989, 08/30/1993  . Meningococcal Conjugate 03/17/2005  . Tdap 01/05/2017    Health Maintenance  Topic Date Due  . Samul Dada  01/06/2027  . INFLUENZA VACCINE  Completed  . HIV Screening  Completed     Discussed health benefits of physical activity, and encouraged him to engage in regular exercise appropriate for his age and condition.    --------------------------------------------------------------------  Problem List Items Addressed This Visit      Musculoskeletal and Integument   Acne vulgaris    Discussed acne care and the need to continue using washes daily and not stopping when it clears up Can use salicylic acid or benzyl peroxide wash in the morning Discussed use of gentle moisturizer after wash He can can try topical retinoid such as Differin cream which is OTC in the evenings as well if needed       Other Visit Diagnoses    Routine general medical examination at a health care facility    -  Primary   Relevant Orders   Lipid panel   Comprehensive metabolic panel   CBC   Screening for lipoid disorders       Relevant Orders   Lipid panel   Comprehensive metabolic panel       Return in 1 year (on 01/12/2019) for CPE.   The entirety of the information documented in the History of Present Illness, Review of Systems and Physical Exam were personally obtained by me. Portions of this information were initially documented by Tiburcio Pea, CMA and reviewed by me for thoroughness and accuracy.    Virginia Crews, MD, MPH [facility] 01/11/2018 9:35 AM

## 2018-01-11 NOTE — Assessment & Plan Note (Signed)
Discussed acne care and the need to continue using washes daily and not stopping when it clears up Can use salicylic acid or benzyl peroxide wash in the morning Discussed use of gentle moisturizer after wash He can can try topical retinoid such as Differin cream which is OTC in the evenings as well if needed

## 2018-01-11 NOTE — Patient Instructions (Addendum)
Non-comedonal lotion (Cetaphil) Benzoyl peroxide wash in the morning with lotion afterward Can try Differin gel at night as well if needed   Health Maintenance, Male A healthy lifestyle and preventive care is important for your health and wellness. Ask your health care provider about what schedule of regular examinations is right for you. What should I know about weight and diet? Eat a Healthy Diet  Eat plenty of vegetables, fruits, whole grains, low-fat dairy products, and lean protein.  Do not eat a lot of foods high in solid fats, added sugars, or salt.  Maintain a Healthy Weight Regular exercise can help you achieve or maintain a healthy weight. You should:  Do at least 150 minutes of exercise each week. The exercise should increase your heart rate and make you sweat (moderate-intensity exercise).  Do strength-training exercises at least twice a week.  Watch Your Levels of Cholesterol and Blood Lipids  Have your blood tested for lipids and cholesterol every 5 years starting at 30 years of age. If you are at high risk for heart disease, you should start having your blood tested when you are 30 years old. You may need to have your cholesterol levels checked more often if: ? Your lipid or cholesterol levels are high. ? You are older than 30 years of age. ? You are at high risk for heart disease.  What should I know about cancer screening? Many types of cancers can be detected early and may often be prevented. Lung Cancer  You should be screened every year for lung cancer if: ? You are a current smoker who has smoked for at least 30 years. ? You are a former smoker who has quit within the past 15 years.  Talk to your health care provider about your screening options, when you should start screening, and how often you should be screened.  Colorectal Cancer  Routine colorectal cancer screening usually begins at 30 years of age and should be repeated every 5-10 years until you  are 30 years old. You may need to be screened more often if early forms of precancerous polyps or small growths are found. Your health care provider may recommend screening at an earlier age if you have risk factors for colon cancer.  Your health care provider may recommend using home test kits to check for hidden blood in the stool.  A small camera at the end of a tube can be used to examine your colon (sigmoidoscopy or colonoscopy). This checks for the earliest forms of colorectal cancer.  Prostate and Testicular Cancer  Depending on your age and overall health, your health care provider may do certain tests to screen for prostate and testicular cancer.  Talk to your health care provider about any symptoms or concerns you have about testicular or prostate cancer.  Skin Cancer  Check your skin from head to toe regularly.  Tell your health care provider about any new moles or changes in moles, especially if: ? There is a change in a mole's size, shape, or color. ? You have a mole that is larger than a pencil eraser.  Always use sunscreen. Apply sunscreen liberally and repeat throughout the day.  Protect yourself by wearing long sleeves, pants, a wide-brimmed hat, and sunglasses when outside.  What should I know about heart disease, diabetes, and high blood pressure?  If you are 6018-30 years of age, have your blood pressure checked every 3-5 years. If you are 30 years of age or older, have your  blood pressure checked every year. You should have your blood pressure measured twice-once when you are at a hospital or clinic, and once when you are not at a hospital or clinic. Record the average of the two measurements. To check your blood pressure when you are not at a hospital or clinic, you can use: ? An automated blood pressure machine at a pharmacy. ? A home blood pressure monitor.  Talk to your health care provider about your target blood pressure.  If you are between 43-71 years old,  ask your health care provider if you should take aspirin to prevent heart disease.  Have regular diabetes screenings by checking your fasting blood sugar level. ? If you are at a normal weight and have a low risk for diabetes, have this test once every three years after the age of 12. ? If you are overweight and have a high risk for diabetes, consider being tested at a younger age or more often.  A one-time screening for abdominal aortic aneurysm (AAA) by ultrasound is recommended for men aged 65-75 years who are current or former smokers. What should I know about preventing infection? Hepatitis B If you have a higher risk for hepatitis B, you should be screened for this virus. Talk with your health care provider to find out if you are at risk for hepatitis B infection. Hepatitis C Blood testing is recommended for:  Everyone born from [birth year] through 1963.  Anyone with known risk factors for hepatitis C.  Sexually Transmitted Diseases (STDs)  You should be screened each year for STDs including gonorrhea and chlamydia if: ? You are sexually active and are younger than 30 years of age. ? You are older than 30 years of age and your health care provider tells you that you are at risk for this type of infection. ? Your sexual activity has changed since you were last screened and you are at an increased risk for chlamydia or gonorrhea. Ask your health care provider if you are at risk.  Talk with your health care provider about whether you are at high risk of being infected with HIV. Your health care provider may recommend a prescription medicine to help prevent HIV infection.  What else can I do?  Schedule regular health, dental, and eye exams.  Stay current with your vaccines (immunizations).  Do not use any tobacco products, such as cigarettes, chewing tobacco, and e-cigarettes. If you need help quitting, ask your health care provider.  Limit alcohol intake to no more than 2 drinks per  day. One drink equals 12 ounces of beer, 5 ounces of wine, or 1 ounces of hard liquor.  Do not use street drugs.  Do not share needles.  Ask your health care provider for help if you need support or information about quitting drugs.  Tell your health care provider if you often feel depressed.  Tell your health care provider if you have ever been abused or do not feel safe at home. This information is not intended to replace advice given to you by your health care provider. Make sure you discuss any questions you have with your health care provider. Document Released: 07/26/2007 Document Revised: 09/26/2015 Document Reviewed: 10/31/2014 Elsevier Interactive Patient Education  [employer].

## 2018-01-12 ENCOUNTER — Telehealth: Payer: Self-pay

## 2018-01-12 LAB — COMPREHENSIVE METABOLIC PANEL
ALBUMIN: 4.7 g/dL (ref 3.5–5.5)
ALT: 18 IU/L (ref 0–44)
AST: 12 IU/L (ref 0–40)
Albumin/Globulin Ratio: 2.4 — ABNORMAL HIGH (ref 1.2–2.2)
Alkaline Phosphatase: 56 IU/L (ref 39–117)
BUN/Creatinine Ratio: 12 (ref 9–20)
BUN: 12 mg/dL (ref 6–20)
Bilirubin Total: 0.5 mg/dL (ref 0.0–1.2)
CALCIUM: 9.2 mg/dL (ref 8.7–10.2)
CHLORIDE: 104 mmol/L (ref 96–106)
CO2: 22 mmol/L (ref 20–29)
CREATININE: 1 mg/dL (ref 0.76–1.27)
GFR calc Af Amer: 116 mL/min/{1.73_m2} (ref >59)
GFR, EST NON AFRICAN AMERICAN: 101 mL/min/{1.73_m2} (ref >59)
GLOBULIN, TOTAL: 2 g/dL (ref 1.5–4.5)
Glucose: 78 mg/dL (ref 65–99)
Potassium: 4 mmol/L (ref 3.5–5.2)
Sodium: 142 mmol/L (ref 134–144)
TOTAL PROTEIN: 6.7 g/dL (ref 6.0–8.5)

## 2018-01-12 LAB — CBC
Hematocrit: 45.1 % (ref 37.5–51.0)
Hemoglobin: 14 g/dL (ref 13.0–17.7)
MCH: 24.1 pg — ABNORMAL LOW (ref 26.6–33.0)
MCHC: 31 g/dL — ABNORMAL LOW (ref 31.5–35.7)
MCV: 78 fL — AB (ref 79–97)
PLATELETS: 242 10*3/uL (ref 150–450)
RBC: 5.81 x10E6/uL — AB (ref 4.14–5.80)
RDW: 14.5 % (ref 12.3–15.4)
WBC: 4.1 10*3/uL (ref 3.4–10.8)

## 2018-01-12 LAB — LIPID PANEL
CHOLESTEROL TOTAL: 140 mg/dL (ref 100–199)
Chol/HDL Ratio: 3.2 ratio (ref 0.0–5.0)
HDL: 44 mg/dL (ref >39)
LDL Calculated: 83 mg/dL (ref 0–99)
Triglycerides: 66 mg/dL (ref 0–149)
VLDL Cholesterol Cal: 13 mg/dL (ref 5–40)

## 2018-01-12 NOTE — Telephone Encounter (Signed)
Patient returned call and was advised. 

## 2018-01-12 NOTE — Telephone Encounter (Signed)
-----   Message from Erasmo DownerAngela M Bacigalupo, MD sent at 01/12/2018  9:46 AM EST ----- Normal labs

## 2018-01-12 NOTE — Telephone Encounter (Signed)
[facility] 

## 2018-01-15 ENCOUNTER — Other Ambulatory Visit: Payer: Self-pay | Admitting: Certified Nurse Midwife

## 2018-01-15 ENCOUNTER — Telehealth: Payer: Self-pay

## 2018-01-15 LAB — SEMEN ANALYSIS, BASIC
Appearance: NORMAL
Concentration, Sperm: 18.1 x10E6/mL (ref >14.9)
IMMOTILE SPERM: 48 %
Leukocyte Concentration: >1 x10E6/mL — ABNORMAL HIGH (ref <1.00)
NORMAL MORPHOLOGY-STRICT: 6 % (ref >3)
Non-Progressive (NP): 21 %
PH: 8.5 (ref >7.1)
PROGRESSIVELY MOTILE SPERM: 28 x10E6
Progressive Motility (PR): 31 % — ABNORMAL LOW (ref >31)
TOTAL MOTILE SPERM: 47 x10E6
Time Collected: 1251
Time Received: 1306
Time Since Last Emission: 5 days
Total Motility (PR+NP): 52 % (ref >39)
Total Sperm in Ejaculate: 90.5 x10E6 (ref >38.9)
Volume: 5 mL (ref >1.4)

## 2018-01-15 NOTE — Telephone Encounter (Signed)
Stephanie from [facility] called stating that she needed a new req from for the pt seman analysis. The incorrect code was for a post vasectomy, she needed a basic. Corrected order number was put in and faxed to her.

## 2018-01-28 ENCOUNTER — Encounter: Payer: Self-pay | Admitting: Urology

## 2018-03-10 ENCOUNTER — Encounter: Payer: Self-pay | Admitting: Family Medicine

## 2018-03-10 ENCOUNTER — Ambulatory Visit: Payer: 59 | Admitting: Family Medicine

## 2018-03-10 ENCOUNTER — Other Ambulatory Visit (HOSPITAL_COMMUNITY)
Admission: RE | Admit: 2018-03-10 | Discharge: 2018-03-10 | Disposition: A | Discharge status: Home / Self Care | Payer: 59 | Source: Ambulatory Visit | Attending: Family Medicine | Admitting: Family Medicine

## 2018-03-10 VITALS — BP 132/79 | HR 83 | Temp 98.2°F | Wt 191.8 lb

## 2018-03-10 DIAGNOSIS — R300 Dysuria: Secondary | ICD-10-CM

## 2018-03-10 LAB — POCT URINALYSIS DIPSTICK
Bilirubin, UA: NEGATIVE
GLUCOSE UA: NEGATIVE
Ketones, UA: NEGATIVE
LEUKOCYTES UA: NEGATIVE
NITRITE UA: NEGATIVE
PROTEIN UA: POSITIVE — AB
Spec Grav, UA: 1.025 (ref 1.010–1.025)
Urobilinogen, UA: 0.2 E.U./dL
pH, UA: 6 (ref 5.0–8.0)

## 2018-03-10 NOTE — Progress Notes (Signed)
Patient: Daniel PaganiniGerald Whitehead Male    DOB: March 05, 1985   32 y.o.   MRN: [id number] Visit Date: 03/10/2018  Today's Provider: Shirlee LatchAngela Nickola Lenig, MD   Chief Complaint  Patient presents with  . Urinary Tract Infection   Subjective:    I, Daniel Whitehead, CMA, am acting as a scribe for Shirlee LatchAngela Chadrick Sprinkle, MD. \  Urinary Tract Infection   This is a new problem. Episode onset: 3-4 days ago. The problem occurs every urination. The problem has been unchanged. The quality of the pain is described as aching and burning. There has been no fever. He is sexually active. Associated symptoms include frequency and hematuria. He has tried nothing for the symptoms. There is no history of kidney stones, recurrent UTIs, a single kidney or a urological procedure.   Had a UTI ~2 months ago and [facility]  Was treated with antibiotics and got better.  Now symptoms have returned.  He was having more symptoms at that time including chills, urinary frequency/urgency.  He is sexually active with only one male partner, his wife.  He states he is not concerned about STD.  He was not checked for STDs at the urgent care.  He denies any penile discharge or abdominal pain.   No Known Allergies   Current Outpatient Medications:  Marland Kitchen.  Multiple Vitamins-Minerals (ZINC PO), Take 1 tablet by mouth every other day., Disp: , Rfl:   Review of Systems  Constitutional: Negative.   Respiratory: Negative.   Cardiovascular: Negative.   Genitourinary: Positive for frequency and hematuria.  Musculoskeletal: Negative.     Social History   Tobacco Use  . Smoking status: Never Smoker  . Smokeless tobacco: Never Used  Substance Use Topics  . Alcohol use: No    Frequency: Never      Objective:   BP 132/79 (BP Location: Right Arm, Patient Position: Sitting, Cuff Size: Normal)   Pulse 83   Temp 98.2 F (36.8 C) (Oral)   Wt 191 lb 12.8 oz (87 kg)   SpO2 98%   BMI 28.32 kg/m  Vitals:   03/10/18 0818  BP: 132/79    Pulse: 83  Temp: 98.2 F (36.8 C)  TempSrc: Oral  SpO2: 98%  Weight: 191 lb 12.8 oz (87 kg)     Physical Exam Vitals signs reviewed.  Constitutional:      General: He is not in acute distress.    Appearance: Normal appearance. He is not diaphoretic.  HENT:     Head: Normocephalic and atraumatic.     Mouth/Throat:     Pharynx: Oropharynx is clear.  Eyes:     General: No scleral icterus.    Conjunctiva/sclera: Conjunctivae normal.  Cardiovascular:     Rate and Rhythm: Normal rate and regular rhythm.     Heart sounds: Normal heart sounds. No murmur.  Pulmonary:     Effort: Pulmonary effort is normal. No respiratory distress.     Breath sounds: Normal breath sounds. No wheezing or rhonchi.  Abdominal:     General: There is no distension.     Palpations: Abdomen is soft.     Tenderness: There is no abdominal tenderness. There is no right CVA tenderness, left CVA tenderness, guarding or rebound.  Musculoskeletal:     Right lower leg: No edema.     Left lower leg: No edema.  Skin:    General: Skin is warm and dry.     Capillary Refill: Capillary refill takes less than  2 seconds.     Findings: No rash.  Neurological:     Mental Status: He is alert and oriented to person, place, and time. Mental status is at baseline.  Psychiatric:        Mood and Affect: Mood normal.        Behavior: Behavior normal.     Results for orders placed or performed in visit on 03/10/18  POCT Urinalysis Dipstick  Result Value Ref Range   Color, UA dark yellow    Clarity, UA clear    Glucose, UA Negative Negative   Bilirubin, UA negative    Ketones, UA negative    Spec Grav, UA 1.025 1.010 - 1.025   Blood, UA hemolyzed small    pH, UA 6.0 5.0 - 8.0   Protein, UA Positive (A) Negative   Urobilinogen, UA 0.2 0.2 or 1.0 E.U./dL   Nitrite, UA negative    Leukocytes, UA Negative Negative   Appearance     Odor         Assessment & Plan   1. Dysuria - Symptoms somewhat concerning for  UTI vs STD, but UA without signs of infection -No systemic symptoms or signs of pyelonephritis, no symptoms of nephrolithiasis - Given trace blood on UA, will send urine micro to confirm  -Will not start empiric treatment -We will send urine culture to confirm  - test for GC/CT and trichomonas - will send ROI for [facility] records of last UTI - if truly having recurrent UTIs, may need urology referral -Discussed return precautions  - Urine Microscopic - Urine cytology ancillary only - Urine Culture - POCT Urinalysis Dipstick   Return if symptoms worsen or fail to improve.   The entirety of the information documented in the History of Present Illness, Review of Systems and Physical Exam were personally obtained by me. Portions of this information were initially documented by Daniel Whitehead, CMA and reviewed by me for thoroughness and accuracy.    Erasmo DownerBacigalupo, Damarco Keysor M, MD, MPH [facility] 03/10/2018 11:21 AM

## 2018-03-10 NOTE — Patient Instructions (Signed)
Can try AZO for pain as well  Dysuria Dysuria is pain or discomfort while urinating. The pain or discomfort may be felt in the part of your body that drains urine from the bladder (urethra) or in the surrounding tissue of the genitals. The pain may also be felt in the groin area, lower abdomen, or lower back. You may have to urinate frequently or have the sudden feeling that you have to urinate (urgency). Dysuria can affect both men and women, but it is more common in women. Dysuria can be caused by many different things, including:  Urinary tract infection.  Kidney stones or bladder stones.  Certain sexually transmitted infections (STIs), such as chlamydia.  Dehydration.  Inflammation of the tissues of the vagina.  Use of certain medicines.  Use of certain soaps or scented products that cause irritation. Follow these instructions at home: General instructions  Watch your condition for any changes.  Urinate often. Avoid holding urine for long periods of time.  After a bowel movement or urination, women should cleanse from front to back, using each tissue only once.  Urinate after sexual intercourse.  Keep all follow-up visits as told by your health care provider. This is important.  If you had any tests done to find the cause of dysuria, it is up to you to get your test results. Ask your health care provider, or the department that is doing the test, when your results will be ready. Eating and drinking   Drink enough fluid to keep your urine pale yellow.  Avoid caffeine, tea, and alcohol. They can irritate the bladder and make dysuria worse. In men, alcohol may irritate the prostate. Medicines  Take over-the-counter and prescription medicines only as told by your health care provider.  If you were prescribed an antibiotic medicine, take it as told by your health care provider. Do not stop taking the antibiotic even if you start to feel better. Contact a health care  provider if:  You have a fever.  You develop pain in your back or sides.  You have nausea or vomiting.  You have blood in your urine.  You are not urinating as often as you usually do. Get help right away if:  Your pain is severe and not relieved with medicines.  You cannot eat or drink without vomiting.  You are confused.  You have a rapid heartbeat while at rest.  You have shaking or chills.  You feel extremely weak. Summary  Dysuria is pain or discomfort while urinating. Many different conditions can lead to dysuria.  If you have dysuria, you may have to urinate frequently or have the sudden feeling that you have to urinate (urgency).  Watch your condition for any changes. Keep all follow-up visits as told by your health care provider.  Make sure that you urinate often and drink enough fluid to keep your urine pale yellow. This information is not intended to replace advice given to you by your health care provider. Make sure you discuss any questions you have with your health care provider. Document Released: 10/26/2003 Document Revised: 11/13/2016 Document Reviewed: 11/13/2016 Elsevier Interactive Patient Education  2019 [employer].

## 2018-03-11 LAB — URINALYSIS, MICROSCOPIC ONLY: Casts: NONE SEEN /lpf

## 2018-03-11 LAB — URINE CYTOLOGY ANCILLARY ONLY
Chlamydia: NEGATIVE
Neisseria Gonorrhea: NEGATIVE
Trichomonas: NEGATIVE

## 2018-03-12 ENCOUNTER — Telehealth: Payer: Self-pay

## 2018-03-12 DIAGNOSIS — N390 Urinary tract infection, site not specified: Secondary | ICD-10-CM

## 2018-03-12 DIAGNOSIS — R319 Hematuria, unspecified: Secondary | ICD-10-CM

## 2018-03-12 LAB — URINE CULTURE

## 2018-03-12 MED ORDER — SULFAMETHOXAZOLE-TRIMETHOPRIM 800-160 MG PO TABS: 1.0000 | ORAL_TABLET | Freq: Two times a day (BID) | ORAL | 0 refills | Status: DC

## 2018-03-12 NOTE — Telephone Encounter (Signed)
-----   Message from Daniel Downer, MD sent at 03/12/2018  1:07 PM EST ----- Culture does show UTI.  Will treat with 5 day course of Bactrim DS (1 tab BID x5d) - ok to send Rx.  If patient agrees, I'd also recommend urology referral given recurrent UTI and hematuria. Negative STD testing

## 2018-03-12 NOTE — Telephone Encounter (Signed)
Patient advised. RX sent to pharmacy. Referral placed.

## 2018-04-13 ENCOUNTER — Ambulatory Visit: Payer: 59 | Admitting: Urology

## 2018-04-13 ENCOUNTER — Encounter: Payer: Self-pay | Admitting: Urology

## 2018-04-13 VITALS — BP 114/74 | HR 77 | Ht 69.0 in | Wt 195.0 lb

## 2018-04-13 DIAGNOSIS — R319 Hematuria, unspecified: Secondary | ICD-10-CM | POA: Diagnosis not present

## 2018-04-13 DIAGNOSIS — N390 Urinary tract infection, site not specified: Secondary | ICD-10-CM | POA: Diagnosis not present

## 2018-04-13 LAB — MICROSCOPIC EXAMINATION: Bacteria, UA: NONE SEEN

## 2018-04-13 LAB — URINALYSIS, COMPLETE
Bilirubin, UA: NEGATIVE
Glucose, UA: NEGATIVE
Ketones, UA: NEGATIVE
Leukocytes, UA: NEGATIVE
Nitrite, UA: NEGATIVE
PH UA: 7 (ref 5.0–7.5)
PROTEIN UA: NEGATIVE
Specific Gravity, UA: 1.025 (ref 1.005–1.030)
UUROB: 0.2 mg/dL (ref 0.2–1.0)

## 2018-04-13 NOTE — Progress Notes (Signed)
   04/13/2018 2:12 PM   [name] March 21, 1985 [id number]  Reason for visit: Follow up rUTI  HPI: I saw Mr. [name] in urology clinic for discussion of recurrent UTIs.  He is a 33 year old healthy African-American male who was previously seen by my partner Dr. Apolinar Junes in fall 2019 for fertility work-up.  Semen analysis at that time was relatively normal.  He is here today for discussion of 2 recent urinary tract infections.  He reports he was diagnosed with a culture proven UTI in November 2019 at an urgent care(records unavailable to me), as well as in January 2020 with staph saprophyticus.  He reports symptoms of urinary urgency, frequency, dysuria, and chills at the time of both infections.  He does report some weak stream and difficulty urinating over the last few months as well.  He denies any prior perineal trauma.  Denies any gross hematuria.  Prior to these episodes, has never had any urinary tract infections.  We discussed the evaluation and treatment of patients with recurrent UTIs at length.  We specifically discussed the differences between asymptomatic bacteriuria and true urinary tract infection.  We discussed the AUA definition of recurrent UTI of at least 2 culture proven symptomatic acute cystitis episodes in a 62-month period, or 3 within a 1 year period.  We discussed the importance of culture directed antibiotic treatment, and antibiotic stewardship.  First-line therapy includes nitrofurantoin(5 days), Bactrim(3 days), or fosfomycin(3 g single dose).  Possible etiologies of recurrent infection include periurethral tissue atrophy in postmenopausal woman, constipation, sexual activity, incomplete emptying, anatomic abnormalities, and even genetic predisposition.  Finally, we discussed the role of perineal hygiene, timed voiding, adequate hydration, cranberry prophylaxis, and low-dose antibiotic prophylaxis.  We will hold off on any imaging at this time, will schedule cystoscopy to  evaluate for urethral stricture with his urinary symptoms and UTI x2  A total of 15 minutes were spent face-to-face with the patient, greater than 50% was spent in patient education, counseling, and coordination of care regarding recurrent urinary tract infections and need for cystoscopy.   Sondra Come, MD  [facility] [address], [address] [place], [place] [address] [phone]

## 2018-04-13 NOTE — Patient Instructions (Addendum)
Urinary Tract Infection, Adult  A urinary tract infection (UTI) is an infection of any part of the urinary tract. The urinary tract includes the kidneys, ureters, bladder, and urethra. These organs make, store, and get rid of urine in the body. Your health care provider may use other names to describe the infection. An upper UTI affects the ureters and kidneys (pyelonephritis). A lower UTI affects the bladder (cystitis) and urethra (urethritis). What are the causes? Most urinary tract infections are caused by bacteria in your genital area, around the entrance to your urinary tract (urethra). These bacteria grow and cause inflammation of your urinary tract. What increases the risk? You are more likely to develop this condition if:  You have a urinary catheter that stays in place (indwelling).  You are not able to control when you urinate or have a bowel movement (you have incontinence).  You are male and you: ? Use a spermicide or diaphragm for birth control. ? Have low estrogen levels. ? Are pregnant.  You have certain genes that increase your risk (genetics).  You are sexually active.  You take antibiotic medicines.  You have a condition that causes your flow of urine to slow down, such as: ? An enlarged prostate, if you are male. ? Blockage in your urethra (stricture). ? A kidney stone. ? A nerve condition that affects your bladder control (neurogenic bladder). ? Not getting enough to drink, or not urinating often.  You have certain medical conditions, such as: ? Diabetes. ? A weak disease-fighting system (immunesystem). ? Sickle cell disease. ? Gout. ? Spinal cord injury. What are the signs or symptoms? Symptoms of this condition include:  Needing to urinate right away (urgently).  Frequent urination or passing small amounts of urine frequently.  Pain or burning with urination.  Blood in the urine.  Urine that smells bad or unusual.  Trouble urinating.  Cloudy  urine.  Vaginal discharge, if you are male.  Pain in the abdomen or the lower back. You may also have:  Vomiting or a decreased appetite.  Confusion.  Irritability or tiredness.  A fever.  Diarrhea. The first symptom in older adults may be confusion. In some cases, they may not have any symptoms until the infection has worsened. How is this diagnosed? This condition is diagnosed based on your medical history and a physical exam. You may also have other tests, including:  Urine tests.  Blood tests.  Tests for sexually transmitted infections (STIs). If you have had more than one UTI, a cystoscopy or imaging studies may be done to determine the cause of the infections. How is this treated? Treatment for this condition includes:  Antibiotic medicine.  Over-the-counter medicines to treat discomfort.  Drinking enough water to stay hydrated. If you have frequent infections or have other conditions such as a kidney stone, you may need to see a health care provider who specializes in the urinary tract (urologist). In rare cases, urinary tract infections can cause sepsis. Sepsis is a life-threatening condition that occurs when the body responds to an infection. Sepsis is treated in the hospital with IV antibiotics, fluids, and other medicines. Follow these instructions at home:  Medicines  Take over-the-counter and prescription medicines only as told by your health care provider.  If you were prescribed an antibiotic medicine, take it as told by your health care provider. Do not stop using the antibiotic even if you start to feel better. General instructions  Make sure you: ? Empty your bladder often and   completely. Do not hold urine for long periods of time. ? Empty your bladder after sex. ? Wipe from front to back after a bowel movement if you are male. Use each tissue one time when you wipe.  Drink enough fluid to keep your urine pale yellow.  Keep all follow-up  visits as told by your health care provider. This is important. Contact a health care provider if:  Your symptoms do not get better after 1-2 days.  Your symptoms go away and then return. Get help right away if you have:  Severe pain in your back or your lower abdomen.  A fever.  Nausea or vomiting. Summary  A urinary tract infection (UTI) is an infection of any part of the urinary tract, which includes the kidneys, ureters, bladder, and urethra.  Most urinary tract infections are caused by bacteria in your genital area, around the entrance to your urinary tract (urethra).  Treatment for this condition often includes antibiotic medicines.  If you were prescribed an antibiotic medicine, take it as told by your health care provider. Do not stop using the antibiotic even if you start to feel better.  Keep all follow-up visits as told by your health care provider. This is important. This information is not intended to replace advice given to you by your health care provider. Make sure you discuss any questions you have with your health care provider. Document Released: 11/06/2004 Document Revised: 08/06/2017 Document Reviewed: 08/06/2017 Elsevier Interactive Patient Education  2019 [employer].   Cystoscopy  Cystoscopy is a procedure that is used to help diagnose and sometimes treat conditions that affect that lower urinary tract. The lower urinary tract includes the bladder and the tube that drains urine from the bladder out of the body (urethra). Cystoscopy is performed with a thin, tube-shaped instrument with a light and camera at the end (cystoscope). The cystoscope may be hard (rigid) or flexible, depending on the goal of the procedure.The cystoscope is inserted through the urethra, into the bladder. Cystoscopy may be recommended if you have:  Urinary tractinfections that keep coming back (recurring).  Blood in the urine (hematuria).  Loss of bladder control (urinary  incontinence) or an overactive bladder.  Unusual cells found in a urine sample.  A blockage in the urethra.  Painful urination.  An abnormality in the bladder found during an intravenous pyelogram (IVP) or CT scan. Cystoscopy may also be done to remove a sample of tissue to be examined under a microscope (biopsy). Tell a health care provider about:  Any allergies you have.  All medicines you are taking, including vitamins, herbs, eye drops, creams, and over-the-counter medicines.  Any problems you or family members have had with anesthetic medicines.  Any blood disorders you have.  Any surgeries you have had.  Any medical conditions you have.  Whether you are pregnant or may be pregnant. What are the risks? Generally, this is a safe procedure. However, problems may occur, including:  Infection.  Bleeding.  Allergic reactions to medicines.  Damage to other structures or organs. What happens before the procedure?  Ask your health care provider about: ? Changing or stopping your regular medicines. This is especially important if you are taking diabetes medicines or blood thinners. ? Taking medicines such as aspirin and ibuprofen. These medicines can thin your blood. Do not take these medicines before your procedure if your health care provider instructs you not to.  Follow instructions from your health care provider about eating or drinking restrictions.  You may be given antibiotic medicine to help prevent infection.  You may have an exam or testing, such as X-rays of the bladder, urethra, or kidneys.  You may have urine tests to check for signs of infection.  Plan to have someone take you home after the procedure. What happens during the procedure?  To reduce your risk of infection,your health care team will wash or sanitize their hands.  You will be given one or more of the following: ? A medicine to help you relax (sedative). ? A medicine to numb the area  (local anesthetic).  The area around the opening of your urethra will be cleaned.  The cystoscope will be passed through your urethra into your bladder.  Germ-free (sterile)fluid will flow through the cystoscope to fill your bladder. The fluid will stretch your bladder so that your surgeon can clearly examine your bladder walls.  The cystoscope will be removed and your bladder will be emptied. The procedure may vary among health care providers and hospitals. What happens after the procedure?  You may have some soreness or pain in your abdomen and urethra. Medicines will be available to help you.  You may have some blood in your urine.  Do not drive for 24 hours if you received a sedative. This information is not intended to replace advice given to you by your health care provider. Make sure you discuss any questions you have with your health care provider. Document Released: 01/25/2000 Document Revised: 11/07/2016 Document Reviewed: 12/14/2014 Elsevier Interactive Patient Education  2019 [employer].

## 2018-04-20 ENCOUNTER — Encounter: Payer: Self-pay | Admitting: Urology

## 2018-04-20 ENCOUNTER — Ambulatory Visit: Payer: 59 | Admitting: Urology

## 2018-04-20 VITALS — BP 144/74 | HR 88 | Ht 69.0 in | Wt 197.0 lb

## 2018-04-20 DIAGNOSIS — N390 Urinary tract infection, site not specified: Secondary | ICD-10-CM

## 2018-04-20 NOTE — Progress Notes (Signed)
Cystoscopy Procedure Note:  Indication: rUTIs, weak stream, r/o urethral stricture  After informed consent and discussion of the procedure and its risks, Daniel Whitehead was positioned and prepped in the standard fashion. Cystoscopy was performed with a flexible cystoscope. The urethra, bladder neck and entire bladder was visualized in a standard fashion. The prostate was small to moderate in size. The ureteral orifices were visualized in their normal location and orientation. No abnormal findings within the bladder, retroflexion normal. Mild urethral narrowing, no distinct stricture and flexible cystoscope passed easily into the bladder.  Imaging: None to review  Findings: Essentially normal cystoscopy, scope passed easily into the bladder  Assessment and Plan: Recommended timed voiding, adequate hydration, and cranberry prophylaxis RTC 6 months for symptom check, sooner if another UTI. Consider CT A/P if additional UTIs  Legrand Rams, MD 04/20/2018

## 2018-04-21 LAB — URINALYSIS, COMPLETE
BILIRUBIN UA: NEGATIVE
Glucose, UA: NEGATIVE
Ketones, UA: NEGATIVE
Leukocytes, UA: NEGATIVE
NITRITE UA: NEGATIVE
PH UA: 7.5 (ref 5.0–7.5)
Protein, UA: NEGATIVE
RBC UA: NEGATIVE
SPEC GRAV UA: 1.025 (ref 1.005–1.030)
Urobilinogen, Ur: 1 mg/dL (ref 0.2–1.0)

## 2018-10-21 ENCOUNTER — Ambulatory Visit: Payer: 59 | Admitting: Urology

## 2018-10-21 ENCOUNTER — Other Ambulatory Visit: Payer: Self-pay

## 2018-10-21 ENCOUNTER — Encounter: Payer: Self-pay | Admitting: Urology

## 2018-10-21 VITALS — BP 115/72 | HR 94 | Ht 69.0 in | Wt 197.0 lb

## 2018-10-21 DIAGNOSIS — N390 Urinary tract infection, site not specified: Secondary | ICD-10-CM | POA: Diagnosis not present

## 2018-10-21 NOTE — Patient Instructions (Signed)
Urinary Tract Infection, Adult A urinary tract infection (UTI) is an infection of any part of the urinary tract. The urinary tract includes:  The kidneys.  The ureters.  The bladder.  The urethra. These organs make, store, and get rid of pee (urine) in the body. What are the causes? This is caused by germs (bacteria) in your genital area. These germs grow and cause swelling (inflammation) of your urinary tract. What increases the risk? You are more likely to develop this condition if:  You have a small, thin tube (catheter) to drain pee.  You cannot control when you pee or poop (incontinence).  You are male, and: ? You use these methods to prevent pregnancy: ? A medicine that kills sperm (spermicide). ? A device that blocks sperm (diaphragm). ? You have low levels of a male hormone (estrogen). ? You are pregnant.  You have genes that add to your risk.  You are sexually active.  You take antibiotic medicines.  You have trouble peeing because of: ? A prostate that is bigger than normal, if you are male. ? A blockage in the part of your body that drains pee from the bladder (urethra). ? A kidney stone. ? A nerve condition that affects your bladder (neurogenic bladder). ? Not getting enough to drink. ? Not peeing often enough.  You have other conditions, such as: ? Diabetes. ? A weak disease-fighting system (immune system). ? Sickle cell disease. ? Gout. ? Injury of the spine. What are the signs or symptoms? Symptoms of this condition include:  Needing to pee right away (urgently).  Peeing often.  Peeing small amounts often.  Pain or burning when peeing.  Blood in the pee.  Pee that smells bad or not like normal.  Trouble peeing.  Pee that is cloudy.  Fluid coming from the vagina, if you are male.  Pain in the belly or lower back. Other symptoms include:  Throwing up (vomiting).  No urge to eat.  Feeling mixed up (confused).  Being tired  and grouchy (irritable).  A fever.  Watery poop (diarrhea). How is this treated? This condition may be treated with:  Antibiotic medicine.  Other medicines.  Drinking enough water. Follow these instructions at home:  Medicines  Take over-the-counter and prescription medicines only as told by your doctor.  If you were prescribed an antibiotic medicine, take it as told by your doctor. Do not stop taking it even if you start to feel better. General instructions  Make sure you: ? Pee until your bladder is empty. ? Do not hold pee for a long time. ? Empty your bladder after sex. ? Wipe from front to back after pooping if you are a male. Use each tissue one time when you wipe.  Drink enough fluid to keep your pee pale yellow.  Keep all follow-up visits as told by your doctor. This is important. Contact a doctor if:  You do not get better after 1-2 days.  Your symptoms go away and then come back. Get help right away if:  You have very bad back pain.  You have very bad pain in your lower belly.  You have a fever.  You are sick to your stomach (nauseous).  You are throwing up. Summary  A urinary tract infection (UTI) is an infection of any part of the urinary tract.  This condition is caused by germs in your genital area.  There are many risk factors for a UTI. These include having a small, thin   tube to drain pee and not being able to control when you pee or poop.  Treatment includes antibiotic medicines for germs.  Drink enough fluid to keep your pee pale yellow. This information is not intended to replace advice given to you by your health care provider. Make sure you discuss any questions you have with your health care provider. Document Released: 07/16/2007 Document Revised: 01/14/2018 Document Reviewed: 08/06/2017 Elsevier Patient Education  2020 Elsevier Inc.  

## 2018-10-21 NOTE — Progress Notes (Signed)
   10/21/2018 10:10 AM   Gerline Legacy [birth year] [id number]  Reason for visit: Follow up rUTIs  HPI: I saw Mr. Palmieri back in urology clinic for follow-up of UTIs.  Briefly, he is a healthy 33 year old male who has had 2 urinary tract infections in the last year.  His last UTI was January 2020.  He has symptoms of urgency, frequency, dysuria, and chills at the time of infection.  He underwent cystoscopy with me in March 2020 that showed subtle urethral narrowing, however no distinct strictures in the scope passed easily into the bladder.  The bladder was grossly normal.  I recommended timed voiding, adequate hydration, and cranberry prophylaxis at that time.  He reports he took cranberry prophylaxis for approximately a month.  He denies any urinary symptoms or UTIs since our last visit, and overall is doing very well.  He denies any gross hematuria or flank pain.   ROS: Please see flowsheet from today's date for complete review of systems.  Physical Exam: BP 115/72   Pulse 94   Ht 5\' 9"  (1.753 m)   Wt 197 lb (89.4 kg)   BMI 29.09 kg/m     Assessment & Plan:   In summary, he is a healthy 33 year old male with 2 UTIs over a year long period.  Cystoscopy in March 2020 showed a subtly narrowed urethra, however the scope passed easily into the bladder, and the bladder was grossly normal.  He only took cranberry prophylaxis for about a month.  He denies any urinary symptoms since our last visit or UTIs, and is doing very well.  I recommended he continue to take cranberry prophylaxis.  We discussed different possible etiologies of UTIs in males including incomplete emptying, genetic predisposition, and kidney stones.  He would like to follow-up on an as-needed basis.  If he develops another UTI, would consider imaging with either CT or KUB/renal ultrasound.  A total of 15 minutes were spent face-to-face with the patient, greater than 50% was spent in patient education, counseling, and  coordination of care regarding recurrent UTIs and prevention strategies.  Billey Co, [facility] [address], [place], [place] [address] [phone]

## 2019-01-13 ENCOUNTER — Encounter: Payer: Self-pay | Admitting: Family Medicine

## 2019-01-13 ENCOUNTER — Ambulatory Visit (INDEPENDENT_AMBULATORY_CARE_PROVIDER_SITE_OTHER): Payer: 59 | Admitting: Family Medicine

## 2019-01-13 ENCOUNTER — Other Ambulatory Visit: Payer: Self-pay

## 2019-01-13 VITALS — BP 134/82 | HR 81 | Temp 97.3°F | Resp 16 | Ht 69.0 in | Wt 197.0 lb

## 2019-01-13 DIAGNOSIS — E663 Overweight: Secondary | ICD-10-CM

## 2019-01-13 DIAGNOSIS — M7551 Bursitis of right shoulder: Secondary | ICD-10-CM | POA: Insufficient documentation

## 2019-01-13 DIAGNOSIS — Z0000 Encounter for general adult medical examination without abnormal findings: Secondary | ICD-10-CM

## 2019-01-13 MED ORDER — MELOXICAM 15 MG PO TABS: 15.0000 mg | ORAL_TABLET | Freq: Every day | ORAL | 0 refills | Status: DC

## 2019-01-13 NOTE — Assessment & Plan Note (Signed)
Longstanding issue, but new diagnosis Seems to have some impingement syndrome from a with pain with internal rotation and Hawkins sign Discussed options for treatment and we will start Mobic daily and home exercise program If not improving, could consider corticosteroid injection or Ortho referral Return precautions discussed

## 2019-01-13 NOTE — Assessment & Plan Note (Signed)
Discussed importance of healthy weight management Discussed diet and exercise  

## 2019-01-13 NOTE — Progress Notes (Signed)
Patient: Daniel Whitehead, Male    DOB: 1985-07-23, 33 y.o.   MRN: [id number] Visit Date: 01/13/2019  Today's Provider: Lavon Paganini, MD   Chief Complaint  Patient presents with  . Annual Exam   Subjective:    I [name], CMA, am acting as scribe for Lavon Paganini, MD.   Annual physical exam Daniel Whitehead is a 33 y.o. male who presents today for health maintenance and complete physical. He feels well. He reports exercising no. He reports he is sleeping well. 01/11/2018 CPE  ----------------------------------------------------------------- R shoulder pain: Ongoing for years.  Worsening recently.  Hurts worse if reaching behind him or certain angles.  No previous injury or trauma.  No previous sports, but did carry a heavy camera around for work on that shoulder previously. Has not tried any medications or ice.  No weakness or numbness  Review of Systems  Constitutional: Negative.   HENT: Negative.   Eyes: Negative.   Respiratory: Negative.   Cardiovascular: Negative.   Gastrointestinal: Negative.   Endocrine: Negative.   Genitourinary: Negative.   Musculoskeletal: Negative.   Skin: Negative.   Allergic/Immunologic: Negative.   Neurological: Negative.   Hematological: Negative.   Psychiatric/Behavioral: Negative.     Social History      He  reports that he has never smoked. He has never used smokeless tobacco. He reports that he does not drink alcohol or use drugs.       Social History   Socioeconomic History  . Marital status: Married    Spouse name: Daniel Whitehead  . Number of children: 0  . Years of education: Not on file  . Highest education level: Bachelor's degree (e.g., BA, AB, BS)  Occupational History  . Occupation: Marketing executive: [employer]  . Financial resource strain: Not on file  . Food insecurity    Worry: Not on file    Inability: Not on file  . Transportation needs    Medical: Not on file   Non-medical: Not on file  Tobacco Use  . Smoking status: Never Smoker  . Smokeless tobacco: Never Used  Substance and Sexual Activity  . Alcohol use: No    Frequency: Never  . Drug use: No  . Sexual activity: Yes    Partners: Female    Birth control/protection: None  Lifestyle  . Physical activity    Days per week: 0 days    Minutes per session: Not on file  . Stress: Not on file  Relationships  . Social Herbalist on phone: Not on file    Gets together: Not on file    Attends religious service: Not on file    Active member of club or organization: Not on file    Attends meetings of clubs or organizations: Not on file    Relationship status: Not on file  Other Topics Concern  . Not on file  Social History Narrative  . Not on file    Past Medical History:  Diagnosis Date  . UTI (urinary tract infection)      Patient Active Problem List   Diagnosis Date Noted  . Acne vulgaris 01/11/2018  . Healthcare maintenance 01/05/2017    Past Surgical History:  Procedure Laterality Date  . [place] EXTRACTION  2005    Family History        Family Status  Relation Name Status  . Mother  Alive  . Father  Deceased  .  Sister  Alive  . MGF  (Not Specified)  . PGM  (Not Specified)  . Neg Hx  (Not Specified)        His family history includes Cataracts in his maternal grandfather; Healthy in his mother and sister; Heart disease in his paternal grandmother; Hypertension in his paternal grandmother; Lung cancer in his father. There is no history of Prostate cancer, Kidney cancer, or Bladder Cancer.      No Known Allergies  No current outpatient medications on file.   Patient Care Team: Virginia Crews, MD as PCP - General (Family Medicine)    Objective:    Vitals: BP 134/82 (BP Location: Left Arm, Patient Position: Sitting, Cuff Size: Normal)   Pulse 81   Temp (!) 97.3 F (36.3 C) (Temporal)   Resp 16   Ht _0  (1.753 m)   Wt 197 lb (89.4 kg)    BMI 29.09 kg/m    Vitals:   01/13/19 0912  BP: 134/82  Pulse: 81  Resp: 16  Temp: (!) 97.3 F (36.3 C)  TempSrc: Temporal  Weight: 197 lb (89.4 kg)  Height: _1  (1.753 m)     Physical Exam Vitals signs reviewed.  Constitutional:      General: He is not in acute distress.    Appearance: Normal appearance. He is well-developed. He is not diaphoretic.  HENT:     Head: Normocephalic and atraumatic.     Right Ear: Tympanic membrane, ear canal and external ear normal.     Left Ear: Tympanic membrane, ear canal and external ear normal.  Eyes:     General: No scleral icterus.    Conjunctiva/sclera: Conjunctivae normal.     Pupils: Pupils are equal, round, and reactive to light.  Neck:     Musculoskeletal: Neck supple.     Thyroid: No thyromegaly.  Cardiovascular:     Rate and Rhythm: Normal rate and regular rhythm.     Pulses: Normal pulses.     Heart sounds: Normal heart sounds. No murmur.  Pulmonary:     Effort: Pulmonary effort is normal. No respiratory distress.     Breath sounds: Normal breath sounds. No wheezing, rhonchi or rales.  Abdominal:     General: There is no distension.     Palpations: Abdomen is soft.     Tenderness: There is no abdominal tenderness.  Musculoskeletal:        General: No deformity.     Right lower leg: No edema.     Left lower leg: No edema.     Comments: R Shoulder: Inspection reveals no abnormalities, atrophy or asymmetry. Palpation is normal with no tenderness over AC joint or bicipital groove. ROM is full, except slighlty less than L shoulder on internal rotation.. Rotator cuff strength normal throughout. + signs of impingement with positive Hawkin's and empty can tests Speeds and Yergason's tests normal. No labral pathology noted with negative Obrien's, negative clunk and good stability. Normal scapular function observed. No painful arc and no drop arm sign. No apprehension sign   Lymphadenopathy:     Cervical: No cervical  adenopathy.  Skin:    General: Skin is warm and dry.     Capillary Refill: Capillary refill takes less than 2 seconds.     Findings: No rash.  Neurological:     Mental Status: He is alert and oriented to person, place, and time. Mental status is at baseline.     Cranial Nerves: No cranial nerve deficit.  Psychiatric:  Mood and Affect: Mood normal.        Behavior: Behavior normal.        Thought Content: Thought content normal.      Depression Screen PHQ 2/9 Scores 01/13/2019 01/11/2018 01/05/2017  PHQ - 2 Score 0 0 0  PHQ- 9 Score 0 0 -      Assessment & Plan:     Routine Health Maintenance and Physical Exam  Exercise Activities and Dietary recommendations Goals   None     Immunization History  Administered Date(s) Administered  . DTaP 03/05/1988, 05/07/1988, 07/08/1988, 04/08/1989, 05/28/1992  . Hepatitis B 10/21/1999, 11/25/1999, 04/06/2000  . IPV 03/05/1988, 05/07/1988, 04/08/1989, 05/28/1992  . Influenza Inj Mdck Quad Pf 11/04/2017  . Influenza,inj,Quad PF,6+ Mos 12/09/2016  . MMR 04/08/1989, 08/30/1993  . Meningococcal Conjugate 03/17/2005  . Tdap 01/05/2017    Health Maintenance  Topic Date Due  . INFLUENZA VACCINE  09/11/2018  . TETANUS/TDAP  01/06/2027  . HIV Screening  Completed     Discussed health benefits of physical activity, and encouraged him to engage in regular exercise appropriate for his age and condition.    --------------------------------------------------------------------  Problem List Items Addressed This Visit      Musculoskeletal and Integument   Bursitis of right shoulder    Longstanding issue, but new diagnosis Seems to have some impingement syndrome from a with pain with internal rotation and Hawkins sign Discussed options for treatment and we will start Mobic daily and home exercise program If not improving, could consider corticosteroid injection or Ortho referral Return precautions discussed        Other    Overweight    Discussed importance of healthy weight management Discussed diet and exercise        Other Visit Diagnoses    Encounter for annual physical exam    -  Primary       Return in about 1 year (around 01/13/2020) for CPE.   The entirety of the information documented in the History of Present Illness, Review of Systems and Physical Exam were personally obtained by me. Portions of this information were initially documented by Lynford Humphrey, CMA and reviewed by me for thoroughness and accuracy.    Bacigalupo, Dionne Bucy, MD MPH [facility]

## 2019-01-13 NOTE — Patient Instructions (Addendum)
Preventive Care 19-31 Years Old, Male Preventive care refers to lifestyle choices and visits with your health care provider that can promote health and wellness. This includes:  A yearly physical exam. This is also called an annual well check.  Regular dental and eye exams.  Immunizations.  Screening for certain conditions.  Healthy lifestyle choices, such as eating a healthy diet, getting regular exercise, not using drugs or products that contain nicotine and tobacco, and limiting alcohol use. What can I expect for my preventive care visit? Physical exam Your health care provider will check:  Height and weight. These may be used to calculate body mass index (BMI), which is a measurement that tells if you are at a healthy weight.  Heart rate and blood pressure.  Your skin for abnormal spots. Counseling Your health care provider may ask you questions about:  Alcohol, tobacco, and drug use.  Emotional well-being.  Home and relationship well-being.  Sexual activity.  Eating habits.  Work and work Statistician. What immunizations do I need?  Influenza (flu) vaccine  This is recommended every year. Tetanus, diphtheria, and pertussis (Tdap) vaccine  You may need a Td booster every 10 years. Varicella (chickenpox) vaccine  You may need this vaccine if you have not already been vaccinated. Human papillomavirus (HPV) vaccine  If recommended by your health care provider, you may need three doses over 6 months. Measles, mumps, and rubella (MMR) vaccine  You may need at least one dose of MMR. You may also need a second dose. Meningococcal conjugate (MenACWY) vaccine  One dose is recommended if you are 45-76 years old and a Market researcher living in a residence hall, or if you have one of several medical conditions. You may also need additional booster doses. Pneumococcal conjugate (PCV13) vaccine  You may need this if you have certain conditions and were not  previously vaccinated. Pneumococcal polysaccharide (PPSV23) vaccine  You may need one or two doses if you smoke cigarettes or if you have certain conditions. Hepatitis A vaccine  You may need this if you have certain conditions or if you travel or work in places where you may be exposed to hepatitis A. Hepatitis B vaccine  You may need this if you have certain conditions or if you travel or work in places where you may be exposed to hepatitis B. Haemophilus influenzae type b (Hib) vaccine  You may need this if you have certain risk factors. You may receive vaccines as individual doses or as more than one vaccine together in one shot (combination vaccines). Talk with your health care provider about the risks and benefits of combination vaccines. What tests do I need? Blood tests  Lipid and cholesterol levels. These may be checked every 5 years starting at age 17.  Hepatitis C test.  Hepatitis B test. Screening   Diabetes screening. This is done by checking your blood sugar (glucose) after you have not eaten for a while (fasting).  Sexually transmitted disease (STD) testing. Talk with your health care provider about your test results, treatment options, and if necessary, the need for more tests. Follow these instructions at home: Eating and drinking   Eat a diet that includes fresh fruits and vegetables, whole grains, lean protein, and low-fat dairy products.  Take vitamin and mineral supplements as recommended by your health care provider.  Do not drink alcohol if your health care provider tells you not to drink.  If you drink alcohol: ? Limit how much you have to 0-2  drinks a day. ? Be aware of how much alcohol is in your drink. In the [place] one drink equals one 12 oz bottle of beer (355 mL), one 5 oz glass of wine (148 mL), or one 1 oz glass of hard liquor (44 mL). Lifestyle  Take daily care of your teeth and gums.  Stay active. Exercise for at least 30 minutes on 5 or  more days each week.  Do not use any products that contain nicotine or tobacco, such as cigarettes, e-cigarettes, and chewing tobacco. If you need help quitting, ask your health care provider.  If you are sexually active, practice safe sex. Use a condom or other form of protection to prevent STIs (sexually transmitted infections). What's next?  Go to your health care provider once a year for a well check visit.  Ask your health care provider how often you should have your eyes and teeth checked.  Stay up to date on all vaccines. This information is not intended to replace advice given to you by your health care provider. Make sure you discuss any questions you have with your health care provider. Document Released: 03/25/2001 Document Revised: 01/21/2018 Document Reviewed: 01/21/2018 Elsevier Patient Education  2020 [place].    Shoulder Impingement Syndrome  Shoulder impingement syndrome is a condition that causes pain when connective tissues (tendons) surrounding the shoulder joint become pinched. These tendons are part of the group of muscles and tissues that help to stabilize the shoulder (rotator cuff). Beneath the rotator cuff is a fluid-filled sac (bursa) that allows the muscles and tendons to glide smoothly. The bursa may become swollen or irritated (bursitis). Bursitis, swelling in the rotator cuff tendons, or both conditions can decrease how much space is under a bone in the shoulder joint (acromion), resulting in impingement. What are the causes? Shoulder impingement syndrome may be caused by bursitis or swelling of the rotator cuff tendons, which may result from:  Repetitive overhead arm movements.  Falling onto the shoulder.  Weakness in the shoulder muscles. What increases the risk? You may be more likely to develop this condition if you:  Play sports that involve throwing, such as baseball.  Participate in sports such as tennis, volleyball, and swimming.  Work  as a Curator, Games developer, or Architect. Some people are also more likely to develop impingement syndrome because of the shape of their acromion bone. What are the signs or symptoms? The main symptom of this condition is pain on the front or side of the shoulder. The pain may:  Get worse when lifting or raising the arm.  Get worse at night.  Wake you up from sleeping.  Feel sharp when the shoulder is moved and then fade to an ache. Other symptoms may include:  Tenderness.  Stiffness.  Inability to raise the arm above shoulder level or behind the body.  Weakness. How is this diagnosed? This condition may be diagnosed based on:  Your symptoms and medical history.  A physical exam.  Imaging tests, such as: ? X-rays. ? MRI. ? Ultrasound. How is this treated? This condition may be treated by:  Resting your shoulder and avoiding all activities that cause pain or put stress on the shoulder.  Icing your shoulder.  NSAIDs to help reduce pain and swelling.  One or more injections of medicines to numb the area and reduce inflammation.  Physical therapy.  Surgery. This may be needed if nonsurgical treatments have not helped. Surgery may involve repairing the rotator cuff, reshaping the  acromion, or removing the bursa. Follow these instructions at home: Managing pain, stiffness, and swelling   If directed, put ice on the injured area. ? Put ice in a plastic bag. ? Place a towel between your skin and the bag. ? Leave the ice on for 20 minutes, 2-3 times a day. Activity  Rest and return to your normal activities as told by your health care provider. Ask your health care provider what activities are safe for you.  Do exercises as told by your health care provider. General instructions  Do not use any products that contain nicotine or tobacco, such as cigarettes, e-cigarettes, and chewing tobacco. These can delay healing. If you need help quitting, ask your health care  provider.  Ask your health care provider when it is safe for you to drive.  Take over-the-counter and prescription medicines only as told by your health care provider.  Keep all follow-up visits as told by your health care provider. This is important. How is this prevented?  Give your body time to rest between periods of activity.  Be safe and responsible while being active. This will help you avoid falls.  Maintain physical fitness, including strength and flexibility. Contact a health care provider if:  Your symptoms have not improved after 1-2 months of treatment and rest.  You cannot lift your arm away from your body. Summary  Shoulder impingement syndrome is a condition that causes pain when connective tissues (tendons) surrounding the shoulder joint become pinched.  The main symptom of this condition is pain on the front or side of the shoulder.  This condition is usually treated with rest, ice, and pain medicines as needed. This information is not intended to replace advice given to you by your health care provider. Make sure you discuss any questions you have with your health care provider. Document Released: 01/27/2005 Document Revised: 05/21/2018 Document Reviewed: 07/22/2017 Elsevier Patient Education  2020 [employer].

## 2020-01-13 NOTE — Patient Instructions (Signed)
Preventive Care 19-32 Years Old, Male Preventive care refers to lifestyle choices and visits with your health care provider that can promote health and wellness. This includes:  A yearly physical exam. This is also called an annual well check.  Regular dental and eye exams.  Immunizations.  Screening for certain conditions.  Healthy lifestyle choices, such as eating a healthy diet, getting regular exercise, not using drugs or products that contain nicotine and tobacco, and limiting alcohol use. What can I expect for my preventive care visit? Physical exam Your health care provider will check:  Height and weight. These may be used to calculate body mass index (BMI), which is a measurement that tells if you are at a healthy weight.  Heart rate and blood pressure.  Your skin for abnormal spots. Counseling Your health care provider may ask you questions about:  Alcohol, tobacco, and drug use.  Emotional well-being.  Home and relationship well-being.  Sexual activity.  Eating habits.  Work and work Statistician. What immunizations do I need?  Influenza (flu) vaccine  This is recommended every year. Tetanus, diphtheria, and pertussis (Tdap) vaccine  You may need a Td booster every 10 years. Varicella (chickenpox) vaccine  You may need this vaccine if you have not already been vaccinated. Human papillomavirus (HPV) vaccine  If recommended by your health care provider, you may need three doses over 6 months. Measles, mumps, and rubella (MMR) vaccine  You may need at least one dose of MMR. You may also need a second dose. Meningococcal conjugate (MenACWY) vaccine  One dose is recommended if you are 45-76 years old and a Market researcher living in a residence hall, or if you have one of several medical conditions. You may also need additional booster doses. Pneumococcal conjugate (PCV13) vaccine  You may need this if you have certain conditions and were not  previously vaccinated. Pneumococcal polysaccharide (PPSV23) vaccine  You may need one or two doses if you smoke cigarettes or if you have certain conditions. Hepatitis A vaccine  You may need this if you have certain conditions or if you travel or work in places where you may be exposed to hepatitis A. Hepatitis B vaccine  You may need this if you have certain conditions or if you travel or work in places where you may be exposed to hepatitis B. Haemophilus influenzae type b (Hib) vaccine  You may need this if you have certain risk factors. You may receive vaccines as individual doses or as more than one vaccine together in one shot (combination vaccines). Talk with your health care provider about the risks and benefits of combination vaccines. What tests do I need? Blood tests  Lipid and cholesterol levels. These may be checked every 5 years starting at age 17.  Hepatitis C test.  Hepatitis B test. Screening   Diabetes screening. This is done by checking your blood sugar (glucose) after you have not eaten for a while (fasting).  Sexually transmitted disease (STD) testing. Talk with your health care provider about your test results, treatment options, and if necessary, the need for more tests. Follow these instructions at home: Eating and drinking   Eat a diet that includes fresh fruits and vegetables, whole grains, lean protein, and low-fat dairy products.  Take vitamin and mineral supplements as recommended by your health care provider.  Do not drink alcohol if your health care provider tells you not to drink.  If you drink alcohol: ? Limit how much you have to 0-2  drinks a day. ? Be aware of how much alcohol is in your drink. In the [place] one drink equals one 12 oz bottle of beer (355 mL), one 5 oz glass of wine (148 mL), or one 1 oz glass of hard liquor (44 mL). Lifestyle  Take daily care of your teeth and gums.  Stay active. Exercise for at least 30 minutes on 5 or  more days each week.  Do not use any products that contain nicotine or tobacco, such as cigarettes, e-cigarettes, and chewing tobacco. If you need help quitting, ask your health care provider.  If you are sexually active, practice safe sex. Use a condom or other form of protection to prevent STIs (sexually transmitted infections). What's next?  Go to your health care provider once a year for a well check visit.  Ask your health care provider how often you should have your eyes and teeth checked.  Stay up to date on all vaccines. This information is not intended to replace advice given to you by your health care provider. Make sure you discuss any questions you have with your health care provider. Document Revised: 01/21/2018 Document Reviewed: 01/21/2018 Elsevier Patient Education  2020 [employer].

## 2020-01-13 NOTE — Progress Notes (Signed)
Complete physical exam   Patient: Daniel Whitehead   DOB: Apr 02, 1985   34 y.o. Male  MRN: [id number] Visit Date: 01/16/2020  Today's healthcare provider: Lavon Paganini, MD   Chief Complaint  Patient presents with  . Annual Exam   Subjective    Daniel Whitehead is a 34 y.o. male who presents today for a complete physical exam.  He reports consuming a general diet. The patient does not participate in regular exercise at present. He generally feels well. He reports sleeping fairly well. He does not have additional problems to discuss today.  HPI   R shoulder pain: Ongoing since last visit with certain movements. HEP x2 m, seemed to make it worse.  L thumb pain x2-3 days. Does a lot of computer work. Never had this before.  Past Medical History:  Diagnosis Date  . UTI (urinary tract infection)    Past Surgical History:  Procedure Laterality Date  . WISDOM TOOTH EXTRACTION  2005   Social History   Socioeconomic History  . Marital status: Married    Spouse name: [name]  . Number of children: 0  . Years of education: Not on file  . Highest education level: Bachelor's degree (e.g., BA, AB, BS)  Occupational History  . Occupation: Marketing executive: [employer]  Tobacco Use  . Smoking status: Never Smoker  . Smokeless tobacco: Never Used  Vaping Use  . Vaping Use: Never used  Substance and Sexual Activity  . Alcohol use: No  . Drug use: No  . Sexual activity: Yes    Partners: Female    Birth control/protection: None  Other Topics Concern  . Not on file  Social History Narrative  . Not on file   Social Determinants of Health   Financial Resource Strain:   . Difficulty of Paying Living Expenses: Not on file  Food Insecurity:   . Worried About Charity fundraiser in the Last Year: Not on file  . Ran Out of Food in the Last Year: Not on file  Transportation Needs:   . Lack of Transportation (Medical): Not on file  . Lack of Transportation  (Non-Medical): Not on file  Physical Activity:   . Days of Exercise per Week: Not on file  . Minutes of Exercise per Session: Not on file  Stress:   . Feeling of Stress : Not on file  Social Connections:   . Frequency of Communication with Friends and Family: Not on file  . Frequency of Social Gatherings with Friends and Family: Not on file  . Attends Religious Services: Not on file  . Active Member of Clubs or Organizations: Not on file  . Attends Archivist Meetings: Not on file  . Marital Status: Not on file  Intimate Partner Violence:   . Fear of Current or Ex-Partner: Not on file  . Emotionally Abused: Not on file  . Physically Abused: Not on file  . Sexually Abused: Not on file   Family Status  Relation Name Status  . Mother  Alive  . Father  Deceased  . Sister  Alive  . MGF  (Not Specified)  . PGM  (Not Specified)  . Neg Hx  (Not Specified)   Family History  Problem Relation Age of Onset  . Healthy Mother   . Lung cancer Father        smoker  . Healthy Sister   . Cataracts Maternal Grandfather   . Hypertension Paternal Grandmother   .  Heart disease Paternal Grandmother   . Prostate cancer Neg Hx   . Kidney cancer Neg Hx   . Bladder Cancer Neg Hx    No Known Allergies  Patient Care Team: Virginia Crews, MD as PCP - General (Family Medicine)   Medications: Outpatient Medications Prior to Visit  Medication Sig  . [DISCONTINUED] meloxicam (MOBIC) 15 MG tablet Take 1 tablet (15 mg total) by mouth daily.   No facility-administered medications prior to visit.    Review of Systems  Constitutional: Negative.   HENT: Negative.   Eyes: Negative.   Respiratory: Negative.   Cardiovascular: Negative.   Gastrointestinal: Negative.   Endocrine: Negative.   Genitourinary: Negative.   Musculoskeletal: Negative.   Skin: Negative.   Allergic/Immunologic: Negative.   Neurological: Negative.   Hematological: Negative.   Psychiatric/Behavioral:  Negative.     Last CBC Lab Results  Component Value Date   WBC 4.1 01/11/2018   HGB 14.0 01/11/2018   HCT 45.1 01/11/2018   MCV 78 (L) 01/11/2018   MCH 24.1 (L) 01/11/2018   RDW 14.5 01/11/2018   PLT 242 60/63/0160   Last metabolic panel Lab Results  Component Value Date   GLUCOSE 78 01/11/2018   NA 142 01/11/2018   K 4.0 01/11/2018   CL 104 01/11/2018   CO2 22 01/11/2018   BUN 12 01/11/2018   CREATININE 1.00 01/11/2018   GFRNONAA 101 01/11/2018   GFRAA 116 01/11/2018   CALCIUM 9.2 01/11/2018   PROT 6.7 01/11/2018   ALBUMIN 4.7 01/11/2018   LABGLOB 2.0 01/11/2018   AGRATIO 2.4 (H) 01/11/2018   BILITOT 0.5 01/11/2018   ALKPHOS 56 01/11/2018   AST 12 01/11/2018   ALT 18 01/11/2018   Last lipids Lab Results  Component Value Date   CHOL 140 01/11/2018   HDL 44 01/11/2018   LDLCALC 83 01/11/2018   TRIG 66 01/11/2018   CHOLHDL 3.2 01/11/2018      Objective    BP 120/76 (BP Location: Left Arm, Patient Position: Sitting, Cuff Size: Normal)   Pulse 74   Temp 99 F (37.2 C) (Oral)   Resp 16   Ht 5' 9"  (1.753 m)   Wt 194 lb (88 kg)   BMI 28.65 kg/m  BP Readings from Last 3 Encounters:  01/16/20 120/76  01/13/19 134/82  10/21/18 115/72   Wt Readings from Last 3 Encounters:  01/16/20 194 lb (88 kg)  01/13/19 197 lb (89.4 kg)  10/21/18 197 lb (89.4 kg)      Physical Exam Vitals reviewed.  Constitutional:      General: He is not in acute distress.    Appearance: Normal appearance. He is well-developed. He is not diaphoretic.  HENT:     Head: Normocephalic and atraumatic.     Right Ear: Tympanic membrane, ear canal and external ear normal.     Left Ear: Tympanic membrane, ear canal and external ear normal.     Nose: Nose normal.     Mouth/Throat:     Mouth: Mucous membranes are moist.     Pharynx: Oropharynx is clear. No oropharyngeal exudate.  Eyes:     General: No scleral icterus.    Conjunctiva/sclera: Conjunctivae normal.     Pupils: Pupils are  equal, round, and reactive to light.  Neck:     Thyroid: No thyromegaly.  Cardiovascular:     Rate and Rhythm: Normal rate and regular rhythm.     Pulses: Normal pulses.     Heart sounds: Normal  heart sounds. No murmur heard.   Pulmonary:     Effort: Pulmonary effort is normal. No respiratory distress.     Breath sounds: Normal breath sounds. No wheezing or rales.  Abdominal:     General: There is no distension.     Palpations: Abdomen is soft.     Tenderness: There is no abdominal tenderness.  Musculoskeletal:        General: No deformity.     Cervical back: Neck supple.     Right lower leg: No edema.     Left lower leg: No edema.     Comments: R Shoulder: Inspection reveals no abnormalities, atrophy or asymmetry. Palpation is normal with no tenderness over AC joint or bicipital groove. ROM is full in all planes. Rotator cuff strength normal throughout. + Hawkin's test  L thumb: Negative Finkelsteins, Tinels, phalens.  Pain in thenar eminence with motion  Lymphadenopathy:     Cervical: No cervical adenopathy.  Skin:    General: Skin is warm and dry.     Findings: No rash.  Neurological:     Mental Status: He is alert and oriented to person, place, and time. Mental status is at baseline.     Sensory: No sensory deficit.     Motor: No weakness.     Gait: Gait normal.  Psychiatric:        Mood and Affect: Mood normal.        Behavior: Behavior normal.        Thought Content: Thought content normal.       Last depression screening scores PHQ 2/9 Scores 01/16/2020 01/13/2019 01/11/2018  PHQ - 2 Score 0 0 0  PHQ- 9 Score 0 0 0   Last fall risk screening Fall Risk  01/16/2020  Falls in the past year? 0  Number falls in past yr: 0  Injury with Fall? 0  Risk for fall due to : No Fall Risks  Follow up Falls evaluation completed   Last Audit-C alcohol use screening Alcohol Use Disorder Test (AUDIT) 01/16/2020  1. How often do you have a drink containing alcohol? 0  2.  How many drinks containing alcohol do you have on a typical day when you are drinking? 0  3. How often do you have six or more drinks on one occasion? 0  AUDIT-C Score 0  Alcohol Brief Interventions/Follow-up AUDIT Score <7 follow-up not indicated   A score of 3 or more in women, and 4 or more in men indicates increased risk for alcohol abuse, EXCEPT if all of the points are from question 1   No results found for any visits on 01/16/20.  Assessment & Plan    Routine Health Maintenance and Physical Exam  Exercise Activities and Dietary recommendations Goals   None     Immunization History  Administered Date(s) Administered  . DTaP 03/05/1988, 05/07/1988, 07/08/1988, 04/08/1989, 05/28/1992  . Hepatitis B 10/21/1999, 11/25/1999, 04/06/2000  . IPV 03/05/1988, 05/07/1988, 04/08/1989, 05/28/1992  . Influenza Inj Mdck Quad Pf 11/04/2017  . Influenza,inj,Quad PF,6+ Mos 12/09/2016  . MMR 04/08/1989, 08/30/1993  . Meningococcal Conjugate 03/17/2005  . Tdap 01/05/2017    Health Maintenance  Topic Date Due  . Hepatitis C Screening  Never done  . COVID-19 Vaccine (1) Never done  . INFLUENZA VACCINE  05/10/2020 (Originally 09/11/2019)  . TETANUS/TDAP  01/06/2027  . HIV Screening  Completed    Discussed health benefits of physical activity, and encouraged him to engage in regular exercise appropriate for  his age and condition.  Problem List Items Addressed This Visit      Musculoskeletal and Integument   Bursitis of right shoulder    Ongoing No relief with HEP Trial of meloxicam x2 wks Referral to PT Patient declines corticosteroid injection at this time      Relevant Orders   Ambulatory referral to Physical Therapy     Other   Overweight    Discussed importance of healthy weight management Discussed diet and exercise       Relevant Orders   Comprehensive metabolic panel   Lipid Panel With LDL/HDL Ratio   Pain of left thumb    Muscle strain No signs of carpal tunnel  syndrome or Dequarvains Discussed overuse - rest, ice, meloxicam prn       Other Visit Diagnoses    Encounter for annual physical exam    -  Primary   Relevant Orders   CBC with Differential/Platelet   Comprehensive metabolic panel   Lipid Panel With LDL/HDL Ratio   Encounter for hepatitis C screening test for low risk patient       Relevant Orders   Hepatitis C antibody       Return in about 1 year (around 01/15/2021) for CPE.     I, Lavon Paganini, MD, have reviewed all documentation for this visit. The documentation on 01/16/20 for the exam, diagnosis, procedures, and orders are all accurate and complete.   Makeila Yamaguchi, Dionne Bucy, MD, MPH [facility]

## 2020-01-16 ENCOUNTER — Other Ambulatory Visit: Payer: Self-pay

## 2020-01-16 ENCOUNTER — Ambulatory Visit (INDEPENDENT_AMBULATORY_CARE_PROVIDER_SITE_OTHER): Payer: BC Managed Care – PPO | Admitting: Family Medicine

## 2020-01-16 ENCOUNTER — Encounter: Payer: Self-pay | Admitting: Family Medicine

## 2020-01-16 VITALS — BP 120/76 | HR 74 | Temp 99.0°F | Resp 16 | Ht 69.0 in | Wt 194.0 lb

## 2020-01-16 DIAGNOSIS — M7551 Bursitis of right shoulder: Secondary | ICD-10-CM | POA: Diagnosis not present

## 2020-01-16 DIAGNOSIS — E663 Overweight: Secondary | ICD-10-CM

## 2020-01-16 DIAGNOSIS — Z1159 Encounter for screening for other viral diseases: Secondary | ICD-10-CM

## 2020-01-16 DIAGNOSIS — Z0000 Encounter for general adult medical examination without abnormal findings: Secondary | ICD-10-CM | POA: Diagnosis not present

## 2020-01-16 DIAGNOSIS — M79645 Pain in left finger(s): Secondary | ICD-10-CM | POA: Insufficient documentation

## 2020-01-16 MED ORDER — MELOXICAM 15 MG PO TABS: 15.0000 mg | ORAL_TABLET | Freq: Every day | ORAL | 0 refills | Status: DC

## 2020-01-16 NOTE — Assessment & Plan Note (Signed)
Discussed importance of healthy weight management Discussed diet and exercise  

## 2020-01-16 NOTE — Assessment & Plan Note (Signed)
Ongoing No relief with HEP Trial of meloxicam x2 wks Referral to PT Patient declines corticosteroid injection at this time

## 2020-01-16 NOTE — Assessment & Plan Note (Signed)
Muscle strain No signs of carpal tunnel syndrome or Dequarvains Discussed overuse - rest, ice, meloxicam prn

## 2020-01-17 ENCOUNTER — Encounter: Payer: Self-pay | Admitting: Family Medicine

## 2020-01-17 LAB — LIPID PANEL WITH LDL/HDL RATIO
Cholesterol, Total: 147 mg/dL (ref 100–199)
HDL: 48 mg/dL (ref >39)
LDL Chol Calc (NIH): 87 mg/dL (ref 0–99)
LDL/HDL Ratio: 1.8 ratio (ref 0.0–3.6)
Triglycerides: 59 mg/dL (ref 0–149)
VLDL Cholesterol Cal: 12 mg/dL (ref 5–40)

## 2020-01-17 LAB — COMPREHENSIVE METABOLIC PANEL
ALT: 14 IU/L (ref 0–44)
AST: 9 IU/L (ref 0–40)
Albumin/Globulin Ratio: 1.8 (ref 1.2–2.2)
Albumin: 4.6 g/dL (ref 4.0–5.0)
Alkaline Phosphatase: 53 IU/L (ref 44–121)
BUN/Creatinine Ratio: 10 (ref 9–20)
BUN: 9 mg/dL (ref 6–20)
Bilirubin Total: 0.5 mg/dL (ref 0.0–1.2)
CO2: 22 mmol/L (ref 20–29)
Calcium: 9.1 mg/dL (ref 8.7–10.2)
Chloride: 104 mmol/L (ref 96–106)
Creatinine, Ser: 0.94 mg/dL (ref 0.76–1.27)
GFR calc Af Amer: 124 mL/min/{1.73_m2} (ref >59)
GFR calc non Af Amer: 107 mL/min/{1.73_m2} (ref >59)
Globulin, Total: 2.5 g/dL (ref 1.5–4.5)
Glucose: 89 mg/dL (ref 65–99)
Potassium: 3.9 mmol/L (ref 3.5–5.2)
Sodium: 141 mmol/L (ref 134–144)
Total Protein: 7.1 g/dL (ref 6.0–8.5)

## 2020-01-17 LAB — CBC WITH DIFFERENTIAL/PLATELET
Basophils Absolute: 0 10*3/uL (ref 0.0–0.2)
Basos: 1 %
EOS (ABSOLUTE): 0 10*3/uL (ref 0.0–0.4)
Eos: 1 %
Hematocrit: 45.7 % (ref 37.5–51.0)
Hemoglobin: 14.2 g/dL (ref 13.0–17.7)
Immature Grans (Abs): 0 10*3/uL (ref 0.0–0.1)
Immature Granulocytes: 0 %
Lymphocytes Absolute: 1.6 10*3/uL (ref 0.7–3.1)
Lymphs: 38 %
MCH: 23.6 pg — ABNORMAL LOW (ref 26.6–33.0)
MCHC: 31.1 g/dL — ABNORMAL LOW (ref 31.5–35.7)
MCV: 76 fL — ABNORMAL LOW (ref 79–97)
Monocytes Absolute: 0.5 10*3/uL (ref 0.1–0.9)
Monocytes: 11 %
Neutrophils Absolute: 2.1 10*3/uL (ref 1.4–7.0)
Neutrophils: 49 %
Platelets: 237 10*3/uL (ref 150–450)
RBC: 6.01 x10E6/uL — ABNORMAL HIGH (ref 4.14–5.80)
RDW: 15.4 % (ref 11.6–15.4)
WBC: 4.2 10*3/uL (ref 3.4–10.8)

## 2020-01-17 LAB — HEPATITIS C ANTIBODY: Hep C Virus Ab: <0.1 s/co ratio (ref 0.0–0.9)

## 2020-03-05 DIAGNOSIS — Z3141 Encounter for fertility testing: Secondary | ICD-10-CM | POA: Diagnosis not present

## 2021-01-17 NOTE — Progress Notes (Signed)
Complete physical exam   Patient: Daniel Whitehead   DOB: 04/28/1985   35 y.o. Male  MRN: [id number] Visit Date: 01/18/2021  Today's healthcare provider: Lavon Paganini, MD   Chief Complaint  Patient presents with   Annual Exam    Subjective    Daniel Whitehead is a 35 y.o. male who presents today for a complete physical exam.  He reports consuming a general diet. Gym/ health club routine includes light weights, mod to heavy weightlifting, and treadmill. He generally feels well. He reports sleeping well. He does have additional problems to discuss today.  HPI  Declined Influenza vaccine today.  R shoulder with limited ROM- HEP did not help.  Some pain at edge of ROM.  Past Medical History:  Diagnosis Date   UTI (urinary tract infection)    Past Surgical History:  Procedure Laterality Date   WISDOM TOOTH EXTRACTION  2005   Social History   Socioeconomic History   Marital status: Married    Spouse name: [name]   Number of children: 0   Years of education: Not on file   Highest education level: Bachelor's degree (e.g., BA, AB, BS)  Occupational History   Occupation: Marketing executive: [employer]  Tobacco Use   Smoking status: Never   Smokeless tobacco: Never  Vaping Use   Vaping Use: Never used  Substance and Sexual Activity   Alcohol use: No   Drug use: No   Sexual activity: Yes    Partners: Female    Birth control/protection: None  Other Topics Concern   Not on file  Social History Narrative   Not on file   Social Determinants of Health   Financial Resource Strain: Not on file  Food Insecurity: Not on file  Transportation Needs: Not on file  Physical Activity: Not on file  Stress: Not on file  Social Connections: Not on file  Intimate Partner Violence: Not on file   Family Status  Relation Name Status   Mother  Alive   Father  Deceased   Sister  Alive   MGF  (Not Specified)   PGM  (Not Specified)   Neg Hx  (Not  Specified)   Family History  Problem Relation Age of Onset   Healthy Mother    Lung cancer Father        smoker   Healthy Sister    Cataracts Maternal Grandfather    Hypertension Paternal Grandmother    Heart disease Paternal Grandmother    Prostate cancer Neg Hx    Kidney cancer Neg Hx    Bladder Cancer Neg Hx    No Known Allergies  Patient Care Team: Virginia Crews, MD as PCP - General (Family Medicine)   Medications: Outpatient Medications Prior to Visit  Medication Sig   meloxicam (MOBIC) 15 MG tablet Take 1 tablet (15 mg total) by mouth daily.   Multiple Vitamin (MULTI VITAMIN DAILY PO) Take by mouth.   No facility-administered medications prior to visit.    Review of Systems  Constitutional: Negative.   HENT: Negative.    Eyes: Negative.   Respiratory: Negative.    Cardiovascular: Negative.   Gastrointestinal: Negative.   Endocrine: Negative.   Genitourinary: Negative.   Musculoskeletal: Negative.   Skin: Negative.   Allergic/Immunologic: Negative.   Neurological: Negative.   Hematological: Negative.      Objective    BP 118/70 (BP Location: Right Arm, Patient Position: Sitting, Cuff Size: Normal)  Pulse 100   Resp 16   Ht 5' 8"  (1.727 m)   Wt 193 lb 11.2 oz (87.9 kg)   SpO2 97%   BMI 29.45 kg/m    Physical Exam Vitals reviewed.  Constitutional:      General: He is not in acute distress.    Appearance: Normal appearance. He is well-developed. He is not diaphoretic.  HENT:     Head: Normocephalic and atraumatic.     Right Ear: Tympanic membrane, ear canal and external ear normal.     Left Ear: Tympanic membrane, ear canal and external ear normal.     Nose: Nose normal.     Mouth/Throat:     Mouth: Mucous membranes are moist.     Pharynx: Oropharynx is clear. No oropharyngeal exudate.  Eyes:     General: No scleral icterus.    Conjunctiva/sclera: Conjunctivae normal.     Pupils: Pupils are equal, round, and reactive to light.  Neck:      Thyroid: No thyromegaly.  Cardiovascular:     Rate and Rhythm: Normal rate and regular rhythm.     Pulses: Normal pulses.     Heart sounds: Normal heart sounds. No murmur heard. Pulmonary:     Effort: Pulmonary effort is normal. No respiratory distress.     Breath sounds: Normal breath sounds. No wheezing or rales.  Abdominal:     General: There is no distension.     Palpations: Abdomen is soft.     Tenderness: There is no abdominal tenderness.  Musculoskeletal:        General: No deformity.     Cervical back: Neck supple.     Right lower leg: No edema.     Left lower leg: No edema.     Comments: R shoulder: No TTP over bony landmarks, no muscle wasting Limited internal rotation  Lymphadenopathy:     Cervical: No cervical adenopathy.  Skin:    General: Skin is warm and dry.     Findings: No rash.  Neurological:     Mental Status: He is alert and oriented to person, place, and time. Mental status is at baseline.     Sensory: No sensory deficit.     Motor: No weakness.     Gait: Gait normal.  Psychiatric:        Mood and Affect: Mood normal.        Behavior: Behavior normal.        Thought Content: Thought content normal.      Last depression screening scores PHQ 2/9 Scores 01/18/2021 01/16/2020 01/13/2019  PHQ - 2 Score 0 0 0  PHQ- 9 Score - 0 0   Last fall risk screening Fall Risk  01/16/2020  Falls in the past year? 0  Number falls in past yr: 0  Injury with Fall? 0  Risk for fall due to : No Fall Risks  Follow up Falls evaluation completed   Last Audit-C alcohol use screening Alcohol Use Disorder Test (AUDIT) 01/16/2020  1. How often do you have a drink containing alcohol? 0  2. How many drinks containing alcohol do you have on a typical day when you are drinking? 0  3. How often do you have six or more drinks on one occasion? 0  AUDIT-C Score 0  Alcohol Brief Interventions/Follow-up AUDIT Score <7 follow-up not indicated   A score of 3 or more in women, and  4 or more in men indicates increased risk for alcohol abuse, EXCEPT if  all of the points are from question 1   No results found for any visits on 01/18/21.  Assessment & Plan    Routine Health Maintenance and Physical Exam  Exercise Activities and Dietary recommendations  Goals   None     Immunization History  Administered Date(s) Administered   DTaP 03/05/1988, 05/07/1988, 07/08/1988, 04/08/1989, 05/28/1992   Hepatitis B 10/21/1999, 11/25/1999, 04/06/2000   IPV 03/05/1988, 05/07/1988, 04/08/1989, 05/28/1992   Influenza Inj Mdck Quad Pf 11/04/2017   Influenza,inj,Quad PF,6+ Mos 12/09/2016   MMR 04/08/1989, 08/30/1993   Meningococcal Conjugate 03/17/2005   Tdap 01/05/2017    Health Maintenance  Topic Date Due   COVID-19 Vaccine (1) Never done   INFLUENZA VACCINE  05/10/2021 (Originally 09/10/2020)   TETANUS/TDAP  01/06/2027   Hepatitis C Screening  Completed   HIV Screening  Completed   Pneumococcal Vaccine 39-46 Years old  Aged Out   HPV VACCINES  Aged Out    Discussed health benefits of physical activity, and encouraged him to engage in regular exercise appropriate for his age and condition.  Problem List Items Addressed This Visit       Other   Overweight    Discussed importance of healthy weight management Discussed diet and exercise       Other Visit Diagnoses     Encounter for annual physical exam    -  Primary   Relevant Orders   Comprehensive metabolic panel   Lipid panel   Chronic right shoulder pain       Relevant Orders   Ambulatory referral to Orthopedic Surgery        Return in about 1 year (around 01/18/2022) for CPE.     I, Lavon Paganini, MD, have reviewed all documentation for this visit. The documentation on 01/18/21 for the exam, diagnosis, procedures, and orders are all accurate and complete.   Norie Latendresse, Dionne Bucy, MD, MPH [facility]

## 2021-01-18 ENCOUNTER — Ambulatory Visit (INDEPENDENT_AMBULATORY_CARE_PROVIDER_SITE_OTHER): Payer: BC Managed Care – PPO | Admitting: Family Medicine

## 2021-01-18 ENCOUNTER — Other Ambulatory Visit: Payer: Self-pay

## 2021-01-18 ENCOUNTER — Encounter: Payer: Self-pay | Admitting: Family Medicine

## 2021-01-18 VITALS — BP 118/70 | HR 100 | Resp 16 | Ht 68.0 in | Wt 193.7 lb

## 2021-01-18 DIAGNOSIS — G8929 Other chronic pain: Secondary | ICD-10-CM

## 2021-01-18 DIAGNOSIS — Z0000 Encounter for general adult medical examination without abnormal findings: Secondary | ICD-10-CM | POA: Diagnosis not present

## 2021-01-18 DIAGNOSIS — M25511 Pain in right shoulder: Secondary | ICD-10-CM

## 2021-01-18 DIAGNOSIS — E663 Overweight: Secondary | ICD-10-CM | POA: Diagnosis not present

## 2021-01-18 NOTE — Assessment & Plan Note (Signed)
Discussed importance of healthy weight management Discussed diet and exercise  

## 2021-01-19 LAB — COMPREHENSIVE METABOLIC PANEL
ALT: 24 IU/L (ref 0–44)
AST: 16 IU/L (ref 0–40)
Albumin/Globulin Ratio: 2.2 (ref 1.2–2.2)
Albumin: 4.9 g/dL (ref 4.0–5.0)
Alkaline Phosphatase: 56 IU/L (ref 44–121)
BUN/Creatinine Ratio: 15 (ref 9–20)
BUN: 14 mg/dL (ref 6–20)
Bilirubin Total: 0.5 mg/dL (ref 0.0–1.2)
CO2: 20 mmol/L (ref 20–29)
Calcium: 9.6 mg/dL (ref 8.7–10.2)
Chloride: 103 mmol/L (ref 96–106)
Creatinine, Ser: 0.93 mg/dL (ref 0.76–1.27)
Globulin, Total: 2.2 g/dL (ref 1.5–4.5)
Glucose: 92 mg/dL (ref 70–99)
Potassium: 4.4 mmol/L (ref 3.5–5.2)
Sodium: 142 mmol/L (ref 134–144)
Total Protein: 7.1 g/dL (ref 6.0–8.5)
eGFR: 111 mL/min/{1.73_m2} (ref >59)

## 2021-01-19 LAB — LIPID PANEL
Chol/HDL Ratio: 2.9 ratio (ref 0.0–5.0)
Cholesterol, Total: 161 mg/dL (ref 100–199)
HDL: 55 mg/dL (ref >39)
LDL Chol Calc (NIH): 95 mg/dL (ref 0–99)
Triglycerides: 51 mg/dL (ref 0–149)
VLDL Cholesterol Cal: 11 mg/dL (ref 5–40)

## 2021-01-22 DIAGNOSIS — M24811 Other specific joint derangements of right shoulder, not elsewhere classified: Secondary | ICD-10-CM | POA: Diagnosis not present

## 2021-01-22 DIAGNOSIS — M25511 Pain in right shoulder: Secondary | ICD-10-CM | POA: Diagnosis not present

## 2021-05-15 DIAGNOSIS — R45851 Suicidal ideations: Secondary | ICD-10-CM | POA: Diagnosis not present

## 2021-07-04 DIAGNOSIS — F4381 Prolonged grief disorder: Secondary | ICD-10-CM | POA: Diagnosis not present

## 2021-07-04 DIAGNOSIS — F3289 Other specified depressive episodes: Secondary | ICD-10-CM | POA: Diagnosis not present

## 2021-07-18 DIAGNOSIS — F3289 Other specified depressive episodes: Secondary | ICD-10-CM | POA: Diagnosis not present

## 2021-07-18 DIAGNOSIS — F4381 Prolonged grief disorder: Secondary | ICD-10-CM | POA: Diagnosis not present

## 2021-08-01 DIAGNOSIS — F3289 Other specified depressive episodes: Secondary | ICD-10-CM | POA: Diagnosis not present

## 2021-08-01 DIAGNOSIS — F4381 Prolonged grief disorder: Secondary | ICD-10-CM | POA: Diagnosis not present

## 2021-08-07 DIAGNOSIS — F4381 Prolonged grief disorder: Secondary | ICD-10-CM | POA: Diagnosis not present

## 2021-08-07 DIAGNOSIS — F3289 Other specified depressive episodes: Secondary | ICD-10-CM | POA: Diagnosis not present

## 2021-08-15 DIAGNOSIS — F3289 Other specified depressive episodes: Secondary | ICD-10-CM | POA: Diagnosis not present

## 2021-08-15 DIAGNOSIS — F4381 Prolonged grief disorder: Secondary | ICD-10-CM | POA: Diagnosis not present

## 2021-09-05 DIAGNOSIS — F3289 Other specified depressive episodes: Secondary | ICD-10-CM | POA: Diagnosis not present

## 2021-09-05 DIAGNOSIS — F4381 Prolonged grief disorder: Secondary | ICD-10-CM | POA: Diagnosis not present

## 2021-09-12 DIAGNOSIS — F4381 Prolonged grief disorder: Secondary | ICD-10-CM | POA: Diagnosis not present

## 2021-09-12 DIAGNOSIS — F3289 Other specified depressive episodes: Secondary | ICD-10-CM | POA: Diagnosis not present

## 2021-09-30 DIAGNOSIS — F3289 Other specified depressive episodes: Secondary | ICD-10-CM | POA: Diagnosis not present

## 2021-09-30 DIAGNOSIS — F4381 Prolonged grief disorder: Secondary | ICD-10-CM | POA: Diagnosis not present

## 2021-10-10 DIAGNOSIS — F3289 Other specified depressive episodes: Secondary | ICD-10-CM | POA: Diagnosis not present

## 2021-10-10 DIAGNOSIS — F4381 Prolonged grief disorder: Secondary | ICD-10-CM | POA: Diagnosis not present

## 2021-10-21 DIAGNOSIS — F4381 Prolonged grief disorder: Secondary | ICD-10-CM | POA: Diagnosis not present

## 2021-10-21 DIAGNOSIS — F3289 Other specified depressive episodes: Secondary | ICD-10-CM | POA: Diagnosis not present

## 2021-11-14 DIAGNOSIS — F4381 Prolonged grief disorder: Secondary | ICD-10-CM | POA: Diagnosis not present

## 2021-11-14 DIAGNOSIS — F3289 Other specified depressive episodes: Secondary | ICD-10-CM | POA: Diagnosis not present

## 2021-12-12 DIAGNOSIS — F4381 Prolonged grief disorder: Secondary | ICD-10-CM | POA: Diagnosis not present

## 2021-12-12 DIAGNOSIS — F3289 Other specified depressive episodes: Secondary | ICD-10-CM | POA: Diagnosis not present

## 2022-01-09 DIAGNOSIS — F4381 Prolonged grief disorder: Secondary | ICD-10-CM | POA: Diagnosis not present

## 2022-01-09 DIAGNOSIS — F3289 Other specified depressive episodes: Secondary | ICD-10-CM | POA: Diagnosis not present

## 2022-01-20 ENCOUNTER — Encounter: Payer: Self-pay | Admitting: Family Medicine

## 2022-01-20 ENCOUNTER — Ambulatory Visit (INDEPENDENT_AMBULATORY_CARE_PROVIDER_SITE_OTHER): Payer: BC Managed Care – PPO | Admitting: Family Medicine

## 2022-01-20 VITALS — BP 117/79 | HR 80 | Resp 16 | Ht 69.0 in | Wt 194.0 lb

## 2022-01-20 DIAGNOSIS — E663 Overweight: Secondary | ICD-10-CM

## 2022-01-20 DIAGNOSIS — M7651 Patellar tendinitis, right knee: Secondary | ICD-10-CM | POA: Diagnosis not present

## 2022-01-20 DIAGNOSIS — Z0000 Encounter for general adult medical examination without abnormal findings: Secondary | ICD-10-CM

## 2022-01-20 DIAGNOSIS — M7631 Iliotibial band syndrome, right leg: Secondary | ICD-10-CM | POA: Diagnosis not present

## 2022-01-20 NOTE — Progress Notes (Signed)
I,[name] J Bragg,acting as a scribe for Lavon Paganini, MD.,have documented all relevant documentation on the behalf of Lavon Paganini, MD,as directed by  Lavon Paganini, MD while in the presence of Lavon Paganini, MD.   Complete physical exam   Patient: Daniel Whitehead   DOB: Oct 26, 1985   36 y.o. Male  MRN: [id number] Visit Date: 01/20/2022  Today's healthcare provider: Lavon Paganini, MD   Chief Complaint  Patient presents with   Annual Exam   Subjective    Daniel Whitehead is a 36 y.o. male who presents today for a complete physical exam.  He reports consuming a general diet. The patient does not participate in regular exercise at present. He generally feels well. He reports sleeping fairly well. He does have additional problems to discuss today.   Patient complains of R knee pain starting 2-3 weeks ago, did not injure himself that he knows of. Lateral knee. Worse with going down steps (feels a little weak, hasn't given out but feels like it might) and crossing leg over other.  No h/o knee problems.  Hasn't tried anything.  No swelling, redness, or bruising.   HPI    Past Medical History:  Diagnosis Date   UTI (urinary tract infection)    Past Surgical History:  Procedure Laterality Date   WISDOM TOOTH EXTRACTION  2005   Social History   Socioeconomic History   Marital status: Married    Spouse name: [name]   Number of children: 0   Years of education: Not on file   Highest education level: Bachelor's degree (e.g., BA, AB, BS)  Occupational History   Occupation: Marketing executive: [employer]  Tobacco Use   Smoking status: Never   Smokeless tobacco: Never  Vaping Use   Vaping Use: Never used  Substance and Sexual Activity   Alcohol use: No   Drug use: No   Sexual activity: Yes    Partners: Female    Birth control/protection: None  Other Topics Concern   Not on file  Social History Narrative   Not on file   Social  Determinants of Health   Financial Resource Strain: Not on file  Food Insecurity: Not on file  Transportation Needs: Not on file  Physical Activity: Unknown (01/05/2017)   Exercise Vital Sign    Days of Exercise per Week: 0 days    Minutes of Exercise per Session: Not on file  Stress: Not on file  Social Connections: Not on file  Intimate Partner Violence: Not on file   Family Status  Relation Name Status   Mother  Alive   Father  Deceased   Sister  Alive   MGF  (Not Specified)   PGM  (Not Specified)   Neg Hx  (Not Specified)   Family History  Problem Relation Age of Onset   Healthy Mother    Lung cancer Father        smoker   Healthy Sister    Cataracts Maternal Grandfather    Hypertension Paternal Grandmother    Heart disease Paternal Grandmother    Prostate cancer Neg Hx    Kidney cancer Neg Hx    Bladder Cancer Neg Hx    No Known Allergies  Patient Care Team: Antwoine Zorn, Dionne Bucy, MD as PCP - General (Family Medicine)   Medications: Outpatient Medications Prior to Visit  Medication Sig   Multiple Vitamin (MULTI VITAMIN DAILY PO) Take by mouth.   meloxicam (MOBIC) 15 MG tablet  Take 1 tablet (15 mg total) by mouth daily.   No facility-administered medications prior to visit.    Review of Systems  All other systems reviewed and are negative.   Last CBC Lab Results  Component Value Date   WBC 4.2 01/16/2020   HGB 14.2 01/16/2020   HCT 45.7 01/16/2020   MCV 76 (L) 01/16/2020   MCH 23.6 (L) 01/16/2020   RDW 15.4 01/16/2020   PLT 237 46/56/8127   Last metabolic panel Lab Results  Component Value Date   GLUCOSE 92 01/18/2021   NA 142 01/18/2021   K 4.4 01/18/2021   CL 103 01/18/2021   CO2 20 01/18/2021   BUN 14 01/18/2021   CREATININE 0.93 01/18/2021   EGFR 111 01/18/2021   CALCIUM 9.6 01/18/2021   PROT 7.1 01/18/2021   ALBUMIN 4.9 01/18/2021   LABGLOB 2.2 01/18/2021   AGRATIO 2.2 01/18/2021   BILITOT 0.5 01/18/2021   ALKPHOS 56 01/18/2021    AST 16 01/18/2021   ALT 24 01/18/2021   Last lipids Lab Results  Component Value Date   CHOL 161 01/18/2021   HDL 55 01/18/2021   LDLCALC 95 01/18/2021   TRIG 51 01/18/2021   CHOLHDL 2.9 01/18/2021   Last hemoglobin A1c No results found for: "HGBA1C" Last thyroid functions No results found for: "TSH", "T3TOTAL", "T4TOTAL", "THYROIDAB" Last vitamin D No results found for: "25OHVITD2", "25OHVITD3", "VD25OH" Last vitamin B12 and Folate No results found for: "VITAMINB12", "FOLATE"    Objective    BP 117/79 (BP Location: Left Arm, Patient Position: Sitting, Cuff Size: Large)   Pulse 80   Resp 16   Ht _0  (1.753 m)   Wt 194 lb (88 kg)   SpO2 99%   BMI 28.65 kg/m  BP Readings from Last 3 Encounters:  01/20/22 117/79  01/18/21 118/70  01/16/20 120/76   Wt Readings from Last 3 Encounters:  01/20/22 194 lb (88 kg)  01/18/21 193 lb 11.2 oz (87.9 kg)  01/16/20 194 lb (88 kg)    Physical Exam Vitals reviewed.  Constitutional:      General: He is not in acute distress.    Appearance: Normal appearance. He is well-developed. He is not diaphoretic.  HENT:     Head: Normocephalic and atraumatic.     Right Ear: Tympanic membrane, ear canal and external ear normal.     Left Ear: Tympanic membrane, ear canal and external ear normal.     Nose: Nose normal.     Mouth/Throat:     Mouth: Mucous membranes are moist.     Pharynx: Oropharynx is clear. No oropharyngeal exudate.  Eyes:     General: No scleral icterus.    Conjunctiva/sclera: Conjunctivae normal.     Pupils: Pupils are equal, round, and reactive to light.  Neck:     Thyroid: No thyromegaly.  Cardiovascular:     Rate and Rhythm: Normal rate and regular rhythm.     Pulses: Normal pulses.     Heart sounds: Normal heart sounds. No murmur heard. Pulmonary:     Effort: Pulmonary effort is normal. No respiratory distress.     Breath sounds: Normal breath sounds. No wheezing or rales.  Abdominal:     General: There  is no distension.     Palpations: Abdomen is soft.     Tenderness: There is no abdominal tenderness.  Musculoskeletal:        General: No deformity.     Cervical back: Neck supple.  Right lower leg: No edema.     Left lower leg: No edema.     Comments: R Knee: Normal to inspection with no erythema or effusion or obvious bony abnormalities.  Palpation normal with no warmth or joint line tenderness or patellar tenderness or condyle tenderness. Mild TTP over R lateral insertion of IT band ROM normal in flexion and extension and lower leg rotation. Ligaments with solid consistent endpoints including ACL, PCL, LCL, MCL. Negative Mcmurray's and provocative meniscal tests. Non painful patellar compression. Patellar tendon with mild TTP and quadriceps tendons unremarkable. Hamstring and quadriceps strength is normal.   Lymphadenopathy:     Cervical: No cervical adenopathy.  Skin:    General: Skin is warm and dry.     Findings: No rash.  Neurological:     Mental Status: He is alert and oriented to person, place, and time. Mental status is at baseline.     Sensory: No sensory deficit.     Motor: No weakness.     Gait: Gait normal.  Psychiatric:        Mood and Affect: Mood normal.        Behavior: Behavior normal.        Thought Content: Thought content normal.       Last depression screening scores    01/20/2022   10:21 AM 01/18/2021    9:42 AM 01/16/2020   10:11 AM  PHQ 2/9 Scores  PHQ - 2 Score 0 0 0  PHQ- 9 Score 0  0   Last fall risk screening    01/20/2022   10:21 AM  [place] in the past year? 0  Number falls in past yr: 0  Injury with Fall? 0  Risk for fall due to : No Fall Risks  Follow up Falls evaluation completed   Last Audit-C alcohol use screening    01/20/2022   10:21 AM  Alcohol Use Disorder Test (AUDIT)  1. How often do you have a drink containing alcohol? 0  2. How many drinks containing alcohol do you have on a typical day when you are  drinking? 0  3. How often do you have six or more drinks on one occasion? 0  AUDIT-C Score 0   A score of 3 or more in women, and 4 or more in men indicates increased risk for alcohol abuse, EXCEPT if all of the points are from question 1   No results found for any visits on 01/20/22.  Assessment & Plan    Routine Health Maintenance and Physical Exam  Exercise Activities and Dietary recommendations  Goals   None     Immunization History  Administered Date(s) Administered   DTaP 03/05/1988, 05/07/1988, 07/08/1988, 04/08/1989, 05/28/1992   Hepatitis B 10/21/1999, 11/25/1999, 04/06/2000   IPV 03/05/1988, 05/07/1988, 04/08/1989, 05/28/1992   Influenza Inj Mdck Quad Pf 11/04/2017   Influenza,inj,Quad PF,6+ Mos 12/09/2016   MMR 04/08/1989, 08/30/1993   Meningococcal Conjugate 03/17/2005   Tdap 01/05/2017    Health Maintenance  Topic Date Due   COVID-19 Vaccine (1) Never done   INFLUENZA VACCINE  05/11/2022 (Originally 09/10/2021)   DTaP/Tdap/Td (7 - Td or Tdap) 01/06/2027   Hepatitis C Screening  Completed   HIV Screening  Completed   HPV VACCINES  Aged Out    Discussed health benefits of physical activity, and encouraged him to engage in regular exercise appropriate for his age and condition.  Problem List Items Addressed This Visit  Other   Overweight   Relevant Orders   Comprehensive metabolic panel   Lipid panel   Other Visit Diagnoses     Encounter for annual physical exam    -  Primary   Relevant Orders   Comprehensive metabolic panel   Lipid panel   Patellar tendinitis of right knee       It band syndrome, right          - new problems - discussed rest, ice, NSAIDs prn - HEP given - jumpers knee strap prn - return precautions discussed   Return in about 1 year (around 01/21/2023) for CPE.     I, Lavon Paganini, MD, have reviewed all documentation for this visit. The documentation on 01/20/22 for the exam, diagnosis, procedures, and orders  are all accurate and complete.   Colston Pyle, Dionne Bucy, MD, MPH [facility]

## 2022-01-21 LAB — LIPID PANEL
Chol/HDL Ratio: 3.4 ratio (ref 0.0–5.0)
Cholesterol, Total: 158 mg/dL (ref 100–199)
HDL: 46 mg/dL (ref >39)
LDL Chol Calc (NIH): 104 mg/dL — ABNORMAL HIGH (ref 0–99)
Triglycerides: 35 mg/dL (ref 0–149)
VLDL Cholesterol Cal: 8 mg/dL (ref 5–40)

## 2022-01-21 LAB — COMPREHENSIVE METABOLIC PANEL
ALT: 24 IU/L (ref 0–44)
AST: 10 IU/L (ref 0–40)
Albumin/Globulin Ratio: 2.2 (ref 1.2–2.2)
Albumin: 4.8 g/dL (ref 4.1–5.1)
Alkaline Phosphatase: 58 IU/L (ref 44–121)
BUN/Creatinine Ratio: 18 (ref 9–20)
BUN: 16 mg/dL (ref 6–20)
Bilirubin Total: 0.3 mg/dL (ref 0.0–1.2)
CO2: 24 mmol/L (ref 20–29)
Calcium: 9.4 mg/dL (ref 8.7–10.2)
Chloride: 104 mmol/L (ref 96–106)
Creatinine, Ser: 0.91 mg/dL (ref 0.76–1.27)
Globulin, Total: 2.2 g/dL (ref 1.5–4.5)
Glucose: 97 mg/dL (ref 70–99)
Potassium: 4.7 mmol/L (ref 3.5–5.2)
Sodium: 142 mmol/L (ref 134–144)
Total Protein: 7 g/dL (ref 6.0–8.5)
eGFR: 113 mL/min/{1.73_m2} (ref >59)

## 2022-01-23 DIAGNOSIS — F4381 Prolonged grief disorder: Secondary | ICD-10-CM | POA: Diagnosis not present

## 2022-01-23 DIAGNOSIS — F3289 Other specified depressive episodes: Secondary | ICD-10-CM | POA: Diagnosis not present

## 2022-02-17 ENCOUNTER — Ambulatory Visit: Payer: BC Managed Care – PPO | Admitting: Family Medicine

## 2022-03-11 DIAGNOSIS — F3289 Other specified depressive episodes: Secondary | ICD-10-CM | POA: Diagnosis not present

## 2022-03-11 DIAGNOSIS — F4381 Prolonged grief disorder: Secondary | ICD-10-CM | POA: Diagnosis not present

## 2022-07-22 DIAGNOSIS — Z3141 Encounter for fertility testing: Secondary | ICD-10-CM | POA: Diagnosis not present

## 2022-11-24 ENCOUNTER — Encounter: Payer: Self-pay | Admitting: Family Medicine

## 2022-11-24 ENCOUNTER — Ambulatory Visit: Payer: BC Managed Care – PPO | Admitting: Family Medicine

## 2022-11-24 VITALS — BP 116/73 | HR 80 | Temp 98.2°F | Ht 69.0 in | Wt 196.5 lb

## 2022-11-24 DIAGNOSIS — G8929 Other chronic pain: Secondary | ICD-10-CM

## 2022-11-24 DIAGNOSIS — M25511 Pain in right shoulder: Secondary | ICD-10-CM

## 2022-11-24 MED ORDER — MELOXICAM 15 MG PO TABS: 15.0000 mg | ORAL_TABLET | Freq: Every day | ORAL | 0 refills | Status: DC

## 2022-11-24 NOTE — Progress Notes (Signed)
   Acute Office Visit  Subjective:     Patient ID: Nieko Clarin III, male    DOB: November 17, 1985, 37 y.o.   MRN: [id number]  Chief Complaint  Patient presents with   Acute Visit    Pt states constant aching right shoulder pain.     HPI Discussed the use of AI scribe software for clinical note transcription with the patient, who gave verbal consent to proceed.  History of Present Illness   The patient, with a history of right shoulder pain for approximately a year, presents with worsening symptoms over the past two weeks. The pain, initially intermittent and provoked by certain movements such as applying deodorant or putting on a shirt, has become constant. They deny any known injury or trauma to the shoulder. They report a previous x-ray revealed a bone chip, and the doctor had suggested the possibility of bursitis (not able to see today). The patient was referred for physical therapy, but they did not attend due to personal circumstances.        ROS      Objective:    BP 116/73 (BP Location: Right Arm, Patient Position: Sitting, Cuff Size: Large)   Pulse 80   Temp 98.2 F (36.8 C) (Oral)   Ht 5\' 9"  (1.753 m)   Wt 196 lb 8 oz (89.1 kg)   SpO2 98%   BMI 29.02 kg/m    Physical Exam  Physical Exam   VITALS: BP- 112/89 MUSCULOSKELETAL: Right shoulder tenderness upon palpation. Range of motion preserved with ability to raise arm straight up, out to the side, and over the head. Reduced internal and external rotation compared to the contralateral side. Difficulty maintaining position against resistance in strength testing, suggesting possible weakness. Positive empty can test indicating potential impingement syndrome.       No results found for any visits on 11/24/22.      Assessment & Plan:   Problem List Items Addressed This Visit   None Visit Diagnoses     Chronic right shoulder pain    -  Primary   Relevant Medications   meloxicam (MOBIC) 15 MG tablet   Other  Relevant Orders   Ambulatory referral to Orthopedic Surgery           Right Shoulder Pain Chronic pain for approximately a year, worsened over the past two weeks to constant pain. Limited internal and external rotation. Positive Hawkins and empty can testing suggestive of impingement syndrome. Previous x-ray reported to show a bone chip per patient report, but not available for review. -Refer to orthopedics at [facility] for further evaluation and management. -Prescribe Meloxicam, an anti-inflammatory, once daily with food. -Advise to ice shoulder intermittently throughout the day (20 minutes on, 20 minutes off). -Check-in if no contact from orthopedics for appointment scheduling.        Meds ordered this encounter  Medications   meloxicam (MOBIC) 15 MG tablet    Sig: Take 1 tablet (15 mg total) by mouth daily.    Dispense:  30 tablet    Refill:  0    Return if symptoms worsen or fail to improve.  Shirlee Latch, MD

## 2022-11-27 ENCOUNTER — Ambulatory Visit: Payer: BC Managed Care – PPO | Admitting: Family Medicine

## 2022-11-28 DIAGNOSIS — M25511 Pain in right shoulder: Secondary | ICD-10-CM | POA: Diagnosis not present

## 2023-01-22 ENCOUNTER — Other Ambulatory Visit: Payer: Self-pay | Admitting: Family Medicine

## 2023-01-22 ENCOUNTER — Encounter: Payer: Self-pay | Admitting: Family Medicine

## 2023-01-22 ENCOUNTER — Ambulatory Visit (INDEPENDENT_AMBULATORY_CARE_PROVIDER_SITE_OTHER): Payer: BC Managed Care – PPO | Admitting: Family Medicine

## 2023-01-22 VITALS — BP 120/69 | HR 91 | Ht 69.0 in | Wt 200.3 lb

## 2023-01-22 DIAGNOSIS — E663 Overweight: Secondary | ICD-10-CM

## 2023-01-22 DIAGNOSIS — Z0000 Encounter for general adult medical examination without abnormal findings: Secondary | ICD-10-CM

## 2023-01-22 NOTE — Progress Notes (Signed)
Complete physical exam  Patient: Daniel Whitehead   DOB: November 04, 1985   37 y.o. Male  MRN: [id number]  Subjective:    Chief Complaint  Patient presents with   Annual Exam    Last completed 01/20/2022.  Patient reports consuming a general well balanced diet. He does not exercise at the moment. Reports feeling well and sleeping well with no concerns.    Immunizations    Patient declined influenza vaccine    Daniel Whitehead is a 37 y.o. male who presents today for a complete physical exam.   Discussed the use of AI scribe software for clinical note transcription with the patient, who gave verbal consent to proceed.  History of Present Illness   The patient presents for an annual physical. They report feeling well and deny any new or ongoing health concerns. They have no concerns to discuss today. They have declined the flu shot but are aware of the COVID-19 vaccine. Their tetanus shot is up to date until 2028. They are aware that their first colon cancer screening will be due at age 30. They have been having annual checks of kidney and liver function, cholesterol, and blood sugar, all of which have been within normal limits.        Most recent fall risk assessment:    11/24/2022    9:18 AM  Fall Risk   Falls in the past year? 0  Injury with Fall? 0  Risk for fall due to : No Fall Risks  Follow up Falls evaluation completed     Most recent depression screenings:    11/24/2022    9:18 AM 01/20/2022   10:21 AM  PHQ 2/9 Scores  PHQ - 2 Score 0 0  PHQ- 9 Score  0        Patient Care Team: Erasmo Downer, MD as PCP - General (Family Medicine)   Outpatient Medications Prior to Visit  Medication Sig   Multiple Vitamin (MULTI VITAMIN DAILY PO) Take by mouth.   [DISCONTINUED] meloxicam (MOBIC) 15 MG tablet Take 1 tablet (15 mg total) by mouth daily.   No facility-administered medications prior to visit.    ROS        Objective:     BP 120/69 (BP  Location: Left Arm, Patient Position: Sitting, Cuff Size: Large)   Pulse 91   Ht 5\' 9"  (1.753 m)   Wt 200 lb 4.8 oz (90.9 kg)   SpO2 100%   BMI 29.58 kg/m    Physical Exam Vitals reviewed.  Constitutional:      General: He is not in acute distress.    Appearance: Normal appearance. He is well-developed. He is not diaphoretic.  HENT:     Head: Normocephalic and atraumatic.     Right Ear: Tympanic membrane, ear canal and external ear normal.     Left Ear: Tympanic membrane, ear canal and external ear normal.     Nose: Nose normal.     Mouth/Throat:     Mouth: Mucous membranes are moist.     Pharynx: Oropharynx is clear. No oropharyngeal exudate.  Eyes:     General: No scleral icterus.    Conjunctiva/sclera: Conjunctivae normal.     Pupils: Pupils are equal, round, and reactive to light.  Neck:     Thyroid: No thyromegaly.  Cardiovascular:     Rate and Rhythm: Normal rate and regular rhythm.     Heart sounds: Normal heart sounds. No murmur heard. Pulmonary:  Effort: Pulmonary effort is normal. No respiratory distress.     Breath sounds: Normal breath sounds. No wheezing or rales.  Abdominal:     General: There is no distension.     Palpations: Abdomen is soft.     Tenderness: There is no abdominal tenderness.  Musculoskeletal:        General: No deformity.     Cervical back: Neck supple.     Right lower leg: No edema.     Left lower leg: No edema.  Lymphadenopathy:     Cervical: No cervical adenopathy.  Skin:    General: Skin is warm and dry.     Findings: No rash.  Neurological:     Mental Status: He is alert and oriented to person, place, and time. Mental status is at baseline.     Gait: Gait normal.  Psychiatric:        Mood and Affect: Mood normal.        Behavior: Behavior normal.        Thought Content: Thought content normal.      No results found for any visits on 01/22/23.     Assessment & Plan:    Routine Health Maintenance and Physical  Exam  Immunization History  Administered Date(s) Administered   DTaP 03/05/1988, 05/07/1988, 07/08/1988, 04/08/1989, 05/28/1992   Hepatitis B 10/21/1999, 11/25/1999, 04/06/2000   IPV 03/05/1988, 05/07/1988, 04/08/1989, 05/28/1992   Influenza Inj Mdck Quad Pf 11/04/2017   Influenza,inj,Quad PF,6+ Mos 12/09/2016   MMR 04/08/1989, 08/30/1993   Meningococcal Conjugate 03/17/2005   Tdap 01/05/2017    Health Maintenance  Topic Date Due   COVID-19 Vaccine (1 - 2024-25 season) Never done   INFLUENZA VACCINE  05/11/2023 (Originally 09/11/2022)   DTaP/Tdap/Td (7 - Td or Tdap) 01/06/2027   Hepatitis C Screening  Completed   HIV Screening  Completed   HPV VACCINES  Aged Out    Discussed health benefits of physical activity, and encouraged him to engage in regular exercise appropriate for his age and condition.  Problem List Items Addressed This Visit       Other   Overweight   Relevant Orders   Comprehensive metabolic panel   Lipid panel   Other Visit Diagnoses       Encounter for annual physical exam    -  Primary   Relevant Orders   Comprehensive metabolic panel   Lipid panel           General Health Maintenance Annual physical examination with no current concerns. Blood pressure is within normal limits. Tetanus vaccination is up to date until 2028. Colon cancer screening to begin at age 23. Routine blood tests for renal and hepatic function, lipid profile, and glucose recommended to monitor overall health. - Order blood tests for renal and hepatic function, lipid profile, and glucose - Schedule next annual physical in one year - Send results via MyChart when available.       Return in 1 year (on 01/22/2024) for CPE.     Shirlee Latch, MD

## 2023-01-22 NOTE — Patient Instructions (Signed)
Health Maintenance, Male Adopting a healthy lifestyle and getting preventive care are important in promoting health and wellness. Ask your health care provider about: The right schedule for you to have regular tests and exams. Things you can do on your own to prevent diseases and keep yourself healthy. What should I know about diet, weight, and exercise? Eat a healthy diet  Eat a diet that includes plenty of vegetables, fruits, low-fat dairy products, and lean protein. Do not eat a lot of foods that are high in solid fats, added sugars, or sodium. Maintain a healthy weight Body mass index (BMI) is a measurement that can be used to identify possible weight problems. It estimates body fat based on height and weight. Your health care provider can help determine your BMI and help you achieve or maintain a healthy weight. Get regular exercise Get regular exercise. This is one of the most important things you can do for your health. Most adults should: Exercise for at least 150 minutes each week. The exercise should increase your heart rate and make you sweat (moderate-intensity exercise). Do strengthening exercises at least twice a week. This is in addition to the moderate-intensity exercise. Spend less time sitting. Even light physical activity can be beneficial. Watch cholesterol and blood lipids Have your blood tested for lipids and cholesterol at 35 years of age, then have this test every 5 years. You may need to have your cholesterol levels checked more often if: Your lipid or cholesterol levels are high. You are older than 35 years of age. You are at high risk for heart disease. What should I know about cancer screening? Many types of cancers can be detected early and may often be prevented. Depending on your health history and family history, you may need to have cancer screening at various ages. This may include screening for: Colorectal cancer. Prostate cancer. Skin cancer. Lung  cancer. What should I know about heart disease, diabetes, and high blood pressure? Blood pressure and heart disease High blood pressure causes heart disease and increases the risk of stroke. This is more likely to develop in people who have high blood pressure readings or are overweight. Talk with your health care provider about your target blood pressure readings. Have your blood pressure checked: Every 3-5 years if you are 62-30 years of age. Every year if you are 16 years old or older. If you are between the ages of 19 and 35 and are a current or former smoker, ask your health care provider if you should have a one-time screening for abdominal aortic aneurysm (AAA). Diabetes Have regular diabetes screenings. This checks your fasting blood sugar level. Have the screening done: Once every three years after age 2 if you are at a normal weight and have a low risk for diabetes. More often and at a younger age if you are overweight or have a high risk for diabetes. What should I know about preventing infection? Hepatitis B If you have a higher risk for hepatitis B, you should be screened for this virus. Talk with your health care provider to find out if you are at risk for hepatitis B infection. Hepatitis C Blood testing is recommended for: Everyone born from [birth year] through 1963. Anyone with known risk factors for hepatitis C. Sexually transmitted infections (STIs) You should be screened each year for STIs, including gonorrhea and chlamydia, if: You are sexually active and are younger than 35 years of age. You are older than 35 years of age and your  health care provider tells you that you are at risk for this type of infection. Your sexual activity has changed since you were last screened, and you are at increased risk for chlamydia or gonorrhea. Ask your health care provider if you are at risk. Ask your health care provider about whether you are at high risk for HIV. Your health care provider  may recommend a prescription medicine to help prevent HIV infection. If you choose to take medicine to prevent HIV, you should first get tested for HIV. You should then be tested every 3 months for as long as you are taking the medicine. Follow these instructions at home: Alcohol use Do not drink alcohol if your health care provider tells you not to drink. If you drink alcohol: Limit how much you have to 0-2 drinks a day. Know how much alcohol is in your drink. In the [place] one drink equals one 12 oz bottle of beer (355 mL), one 5 oz glass of wine (148 mL), or one 1 oz glass of hard liquor (44 mL). Lifestyle Do not use any products that contain nicotine or tobacco. These products include cigarettes, chewing tobacco, and vaping devices, such as e-cigarettes. If you need help quitting, ask your health care provider. Do not use street drugs. Do not share needles. Ask your health care provider for help if you need support or information about quitting drugs. General instructions Schedule regular health, dental, and eye exams. Stay current with your vaccines. Tell your health care provider if: You often feel depressed. You have ever been abused or do not feel safe at home. Summary Adopting a healthy lifestyle and getting preventive care are important in promoting health and wellness. Follow your health care provider's instructions about healthy diet, exercising, and getting tested or screened for diseases. Follow your health care provider's instructions on monitoring your cholesterol and blood pressure. This information is not intended to replace advice given to you by your health care provider. Make sure you discuss any questions you have with your health care provider. Document Revised: 06/18/2020 Document Reviewed: 06/18/2020 Elsevier Patient Education  2024 [employer]  American Heart Association [facility]) Exercise Recommendation  Being physically active is important to prevent heart  disease and stroke, the nation's No. 1and No. 5killers. To improve overall cardiovascular health, we suggest at least 150 minutes per week of moderate exercise or 75 minutes per week of vigorous exercise (or a combination of moderate and vigorous activity). Thirty minutes a day, five times a week is an easy goal to remember. You will also experience benefits even if you divide your time into two or three segments of 10 to 15 minutes per day.  For people who would benefit from lowering their blood pressure or cholesterol, we recommend 40 minutes of aerobic exercise of moderate to vigorous intensity three to four times a week to lower the risk for heart attack and stroke.  Physical activity is anything that makes you move your body and burn calories.  This includes things like climbing stairs or playing sports. Aerobic exercises benefit your heart, and include walking, jogging, swimming or biking. Strength and stretching exercises are best for overall stamina and flexibility.  The simplest, positive change you can make to effectively improve your heart health is to start walking. It's enjoyable, free, easy, social and great exercise. A walking program is flexible and boasts high success rates because people can stick with it. It's easy for walking to become a regular and satisfying part of  life.   For Overall Cardiovascular Health: At least 30 minutes of moderate-intensity aerobic activity at least 5 days per week for a total of 150  OR  At least 25 minutes of vigorous aerobic activity at least 3 days per week for a total of 75 minutes; or a combination of moderate- and vigorous-intensity aerobic activity  AND  Moderate- to high-intensity muscle-strengthening activity at least 2 days per week for additional health benefits.  For Lowering Blood Pressure and Cholesterol An average 40 minutes of moderate- to vigorous-intensity aerobic activity 3 or 4 times per week  What if I can't make it to the  time goal? Something is always better than nothing! And everyone has to start somewhere. Even if you've been sedentary for years, today is the day you can begin to make healthy changes in your life. If you don't think you'll make it for 30 or 40 minutes, set a reachable goal for today. You can work up toward your overall goal by increasing your time as you get stronger. Don't let all-or-nothing thinking rob you of doing what you can every day.  Source:http://www.heart.org

## 2023-01-23 LAB — LIPID PANEL
Chol/HDL Ratio: 3.3 {ratio} (ref 0.0–5.0)
Cholesterol, Total: 150 mg/dL (ref 100–199)
HDL: 45 mg/dL (ref >39)
LDL Chol Calc (NIH): 87 mg/dL (ref 0–99)
Triglycerides: 96 mg/dL (ref 0–149)
VLDL Cholesterol Cal: 18 mg/dL (ref 5–40)

## 2023-01-23 LAB — COMPREHENSIVE METABOLIC PANEL
ALT: 30 [IU]/L (ref 0–44)
AST: 14 [IU]/L (ref 0–40)
Albumin: 4.4 g/dL (ref 4.1–5.1)
Alkaline Phosphatase: 60 [IU]/L (ref 44–121)
BUN/Creatinine Ratio: 13 (ref 9–20)
BUN: 12 mg/dL (ref 6–20)
Bilirubin Total: 0.3 mg/dL (ref 0.0–1.2)
CO2: 22 mmol/L (ref 20–29)
Calcium: 9.3 mg/dL (ref 8.7–10.2)
Chloride: 106 mmol/L (ref 96–106)
Creatinine, Ser: 0.95 mg/dL (ref 0.76–1.27)
Globulin, Total: 2.4 g/dL (ref 1.5–4.5)
Glucose: 120 mg/dL — ABNORMAL HIGH (ref 70–99)
Potassium: 4.4 mmol/L (ref 3.5–5.2)
Sodium: 145 mmol/L — ABNORMAL HIGH (ref 134–144)
Total Protein: 6.8 g/dL (ref 6.0–8.5)
eGFR: 107 mL/min/{1.73_m2} (ref >59)

## 2024-01-25 ENCOUNTER — Encounter: Payer: Self-pay | Admitting: Family Medicine
